# Patient Record
Sex: Female | Born: 1990 | Hispanic: Yes | Marital: Single | Smoking: Never smoker
Health system: Southern US, Community
[De-identification: ages and names within clinical notes are randomized; demographics above are authoritative.]

## PROBLEM LIST (undated history)

## (undated) DIAGNOSIS — K8021 Calculus of gallbladder without cholecystitis with obstruction: Secondary | ICD-10-CM

## (undated) DIAGNOSIS — N12 Tubulo-interstitial nephritis, not specified as acute or chronic: Secondary | ICD-10-CM

## (undated) DIAGNOSIS — O24419 Gestational diabetes mellitus in pregnancy, unspecified control: Secondary | ICD-10-CM

## (undated) HISTORY — DX: Calculus of gallbladder without cholecystitis with obstruction: K80.21

## (undated) HISTORY — PX: NO PAST SURGERIES: SHX2092

## (undated) HISTORY — DX: Gestational diabetes mellitus in pregnancy, unspecified control: O24.419

---
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
## 2014-03-05 ENCOUNTER — Encounter: Payer: Self-pay | Admitting: Obstetrics and Gynecology

## 2014-03-26 ENCOUNTER — Encounter: Payer: Self-pay | Admitting: Obstetrics and Gynecology

## 2014-04-23 ENCOUNTER — Encounter: Payer: Self-pay | Admitting: Obstetrics and Gynecology

## 2014-09-12 ENCOUNTER — Inpatient Hospital Stay: Payer: Self-pay | Admitting: Obstetrics and Gynecology

## 2014-09-12 LAB — CBC WITH DIFFERENTIAL/PLATELET
BASOS ABS: 0 10*3/uL (ref 0.0–0.1)
BASOS PCT: 0.3 %
EOS ABS: 0.2 10*3/uL (ref 0.0–0.7)
Eosinophil %: 1.5 %
HCT: 37.9 % (ref 35.0–47.0)
HGB: 12.5 g/dL (ref 12.0–16.0)
Lymphocyte #: 2.2 10*3/uL (ref 1.0–3.6)
Lymphocyte %: 19.3 %
MCH: 27.9 pg (ref 26.0–34.0)
MCHC: 32.9 g/dL (ref 32.0–36.0)
MCV: 85 fL (ref 80–100)
MONO ABS: 0.7 x10 3/mm (ref 0.2–0.9)
Monocyte %: 6.1 %
NEUTROS ABS: 8.5 10*3/uL — AB (ref 1.4–6.5)
Neutrophil %: 72.8 %
Platelet: 185 10*3/uL (ref 150–440)
RBC: 4.47 10*6/uL (ref 3.80–5.20)
RDW: 13.9 % (ref 11.5–14.5)
WBC: 11.6 10*3/uL — ABNORMAL HIGH (ref 3.6–11.0)

## 2014-09-13 LAB — GC/CHLAMYDIA PROBE AMP

## 2014-09-13 LAB — HEMOGLOBIN: HGB: 11.6 g/dL — ABNORMAL LOW (ref 12.0–16.0)

## 2014-12-26 NOTE — Consult Note (Signed)
Referral Information:  Reason for Referral h/o late second/ early third trimester loss   Referring Physician [facility]   Prenatal Hx 24 yo g4 [id number] married Latina female with L[facility] now [redacted] weeks gestation an interpreter was used for the interview. Her second pregnancy was abnormal - she developed LLQ pains at about 4 months - [place] . She had "too much  fluid " and "baby was stuck on one side" . She went into labor at 23-27 weeks and delivered a morphologically normal female who lived for a short time "skin was thin" (sounded like a second trimester fetus ) nothing abnormal noted otherwise- "normal size" . She declined autopsy due to reluctance to leave her daughter behind . Her mother discarded the pictures of the baby because she thought it would make her depression worse. Pt became tearful talking about the experience. The pt thinks she may be able to get a relative to retrieve records if available.   Past Obstetrical Hx 2007- female term 6+ lbs delivered in hospital (pt was 24yo) no complications 2009- female - 23-27 weeks neonatal demise abnormal fluid /stuck on one side on u/s per pt 2010- female term  38+ weeks 6+ lbs healthy  both of her children [place] with her mother [place]   Home Medications: Medication Instructions Status  Prenatal Multivitamins Prenatal Multivitamins oral tablet 1 tab(s) orally once a day Active   Allergies:   No Known Allergies:   Vital Signs/Notes:  Nursing Vital Signs: **Vital Signs.:   02-Jul-15 10:02  Vital Signs Type Routine  Temperature Temperature (F) 98.1  Celsius 36.7  Temperature Source oral  Pulse Pulse 76  Respirations Respirations 18  Systolic BP Systolic BP 129  Diastolic BP (mmHg) Diastolic BP (mmHg) 59  Mean BP 82  Pulse Ox % Pulse Ox % 100  Pulse Ox Activity Level  At rest  Oxygen Delivery Room Air/ 21 %   Perinatal Consult:  LMP 25-Dec-2013   PGyn Hx regular menses   PMed Hx  Rubella Immune, Hx of varicella   Past Medical History cont'd no HTn, no DM , no infectious dieases   PSurg Hx none   FHx paternal aunt had 4 pregnancy losses   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, agriculturalchemical exposures   Soc Hx married   Review Of Systems:  Subjective mild nausea    Additional Lab/Radiology Notes Aneg, antibody neg  RPR neg, hep B sag neg rub imm, varicella imm UC&S Klebsiella amp res sens to other antibiotics   Impression/Recommendations:  Impression IUP at 10 weeks by dates  1-h/o late second tri /early third trimester delivery and neonatal loss - pt has had other healthy outcomes, no underlying medical problems or exposures,Pt describes  "extra fluid " "stuck on one side" might be c/w arthrogryposis/ neuromuscular  type syndrome but without more documentation of the neonate's condition we will not be able to diagnose accurately, no gross anomaly noted by pt - aneuploidy is  another common cause of second tri loss,  pt declines Down syndrome screening. Given delivery was asociated with u/s diagnosis of "extra fluid, stuck fetus " I would not rx as if a spontaneous preterm birth so no 17 P . Also since not IUFD and since normally grown fetus lost will not check Antiphospholipid labs. 2 Rh neg no irregular antibodies 3 Klebsiella UTI   Recommendations 1. Pt will contact family and see if  any records are available to bring for u/s appt  2. She will return for a scan at the end of July she will be 13-14 weeks then ,  3. Rho Gam at 28 weeks and pp 4. antibiotics for UTI pt going to [facility] after our appt reassured her as to safety and necessity of Rx , repeat C&S at  [facility] routine or  q trimester   Plan:  Genetic Counseling yes, will offer at next visit   Prenatal Diagnosis Options u/s ordered   Delivery Mode Vaginal   Additional Testing Folate/prenatal vitamins   Delivery at what gestational age per routine     Total Time Spent with Patient 30 minutes   >50% of visit spent in couseling/coordination of care yes   Office Use Only [id number]  Level 2 ( ) NEW office consult exp prob focused   Coding Description: MATERNAL CONDITIONS/HISTORY INDICATION(S).   History of congenital anomaly.  Electronic Signatures: Rondall Allegra (MD)  (Signed 02-Jul-15 11:33)  Authored: Referral, Home Medications, Allergies, Vital Signs/Notes, Consult, Exam, Lab/Radiology Notes, Impression, Plan, Billing, Coding Description   Last Updated: 02-Jul-15 11:33 by Rondall Allegra (MD)

## 2017-03-16 ENCOUNTER — Emergency Department: Payer: Self-pay

## 2017-03-16 ENCOUNTER — Encounter: Payer: Self-pay | Admitting: *Deleted

## 2017-03-16 ENCOUNTER — Emergency Department
Admission: EM | Admit: 2017-03-16 | Discharge: 2017-03-16 | Disposition: A | Discharge status: Home / Self Care | Payer: Self-pay | Attending: Emergency Medicine | Admitting: Emergency Medicine

## 2017-03-16 DIAGNOSIS — K8020 Calculus of gallbladder without cholecystitis without obstruction: Secondary | ICD-10-CM | POA: Insufficient documentation

## 2017-03-16 DIAGNOSIS — K8021 Calculus of gallbladder without cholecystitis with obstruction: Secondary | ICD-10-CM

## 2017-03-16 LAB — COMPREHENSIVE METABOLIC PANEL
ALBUMIN: 3.8 g/dL (ref 3.5–5.0)
ALT: 24 U/L (ref 14–54)
ANION GAP: 6 (ref 5–15)
AST: 22 U/L (ref 15–41)
Alkaline Phosphatase: 85 U/L (ref 38–126)
BUN: 13 mg/dL (ref 6–20)
CALCIUM: 8.6 mg/dL — AB (ref 8.9–10.3)
CO2: 27 mmol/L (ref 22–32)
Chloride: 104 mmol/L (ref 101–111)
Creatinine, Ser: 0.76 mg/dL (ref 0.44–1.00)
GFR calc non Af Amer: >60 mL/min (ref >60)
GLUCOSE: 118 mg/dL — AB (ref 65–99)
Potassium: 3.9 mmol/L (ref 3.5–5.1)
SODIUM: 137 mmol/L (ref 135–145)
Total Bilirubin: 0.4 mg/dL (ref 0.3–1.2)
Total Protein: 7.1 g/dL (ref 6.5–8.1)

## 2017-03-16 LAB — CBC
HCT: 37.3 % (ref 35.0–47.0)
Hemoglobin: 12.6 g/dL (ref 12.0–16.0)
MCH: 28.2 pg (ref 26.0–34.0)
MCHC: 33.9 g/dL (ref 32.0–36.0)
MCV: 83.2 fL (ref 80.0–100.0)
Platelets: 241 10*3/uL (ref 150–440)
RBC: 4.48 MIL/uL (ref 3.80–5.20)
RDW: 14 % (ref 11.5–14.5)
WBC: 9.2 10*3/uL (ref 3.6–11.0)

## 2017-03-16 LAB — URINALYSIS, COMPLETE (UACMP) WITH MICROSCOPIC
Bilirubin Urine: NEGATIVE
GLUCOSE, UA: NEGATIVE mg/dL
Hgb urine dipstick: NEGATIVE
KETONES UR: NEGATIVE mg/dL
Nitrite: NEGATIVE
PROTEIN: NEGATIVE mg/dL
Specific Gravity, Urine: 1.024 (ref 1.005–1.030)
pH: 5 (ref 5.0–8.0)

## 2017-03-16 LAB — LIPASE, BLOOD: LIPASE: 24 U/L (ref 11–51)

## 2017-03-16 LAB — POCT PREGNANCY, URINE: PREG TEST UR: NEGATIVE

## 2017-03-16 MED ORDER — ONDANSETRON HCL 4 MG/2ML IJ SOLN
4.0000 mg | INTRAMUSCULAR | Status: AC
  Administered 2017-03-16: 4 mg via INTRAVENOUS
  Filled 2017-03-16: qty 2

## 2017-03-16 MED ORDER — MORPHINE SULFATE (PF) 4 MG/ML IV SOLN
4.0000 mg | Freq: Once | INTRAVENOUS | Status: AC
  Administered 2017-03-16: 4 mg via INTRAVENOUS
  Filled 2017-03-16: qty 1

## 2017-03-16 MED ORDER — KETOROLAC TROMETHAMINE 30 MG/ML IJ SOLN
15.0000 mg | Freq: Once | INTRAMUSCULAR | Status: AC
  Administered 2017-03-16: 15 mg via INTRAVENOUS
  Filled 2017-03-16: qty 1

## 2017-03-16 MED ORDER — ONDANSETRON HCL 4 MG/2ML IJ SOLN: 4.0000 mg | Freq: Once | INTRAMUSCULAR | Status: DC | PRN

## 2017-03-16 MED ORDER — TRAMADOL HCL 50 MG PO TABS: 50.0000 mg | ORAL_TABLET | Freq: Four times a day (QID) | ORAL | 0 refills | Status: AC | PRN

## 2017-03-16 NOTE — ED Notes (Signed)
Pt called out and interpreter request was made by Belenda CruiseKristin, RN

## 2017-03-16 NOTE — ED Notes (Signed)
Interpreter facilitated communication.  Pt wanted to know when the surgeon would be in to speak to her.

## 2017-03-16 NOTE — ED Triage Notes (Signed)
Pt c/o epigastric pain x 4 days, nausea and denies vomiting and diarrhea.

## 2017-03-16 NOTE — Consult Note (Signed)
SURGICAL CONSULTATION NOTE (initial) - cpt: 69629 (Outpatient/ED)  HISTORY OF PRESENT ILLNESS (HPI):  26 y.o. spanish-speaking female presented to [facility] ED with RUQ > epigastric abdominal pain x 6.5 days. She states the pain first awoke her from sleep ~2 am after eating pork and cheese empanadas. It then initially began to improve until she ate beef and cheese the following night, after which the pain returned, but again improved without intervention. After the pain recurred similarly once more (and more severely) last night after again eating dinner containing meat and cheese, patient presented to [facility] ED for further evaluation. Patient reports mild nausea sometimes follows the pain, but she otherwise denies any emesis, diarrhea, fever/chills, CP, or SOB. She also states the pain has near-completely resolved since resolving a dose of Toradol a few hours ago, and it has not since returned. Other than these episodes described above, patient reports she is not aware of any similar prior episodes.  The entirety of the above history was obtained with the assistance of translation services present bedside.  Surgery is consulted by ED physician Dr. Cyril Loosen in this context for evaluation and management of symptomatic cholelithiasis.  PAST MEDICAL HISTORY (PMH):  History reviewed. No pertinent past medical history.   PAST SURGICAL HISTORY (PSH):  History reviewed. No pertinent surgical history.   MEDICATIONS:  Prior to Admission medications   Not on File     ALLERGIES:  No Known Allergies   SOCIAL HISTORY:  Social History   Social History  . Marital status: Single    Spouse name: N/A  . Number of children: N/A  . Years of education: N/A   Occupational History  . Not on file.   Social History Main Topics  . Smoking status: Never Smoker  . Smokeless tobacco: Never Used  . Alcohol use No  . Drug use: No  . Sexual activity: Not on file   Other Topics Concern  . Not on file   Social  History Narrative  . No narrative on file    The patient currently resides (home / rehab facility / nursing home): Home  The patient normally is (ambulatory / bedbound): Ambulatory   FAMILY HISTORY:  History reviewed. No pertinent family history.   REVIEW OF SYSTEMS:  Constitutional: denies weight loss, fever, chills, or sweats  Eyes: denies any other vision changes, history of eye injury  ENT: denies sore throat, hearing problems  Respiratory: denies shortness of breath, wheezing  Cardiovascular: denies chest pain, palpitations  Gastrointestinal: abdominal pain, N/V, and bowel function as per HPI Genitourinary: denies burning with urination or urinary frequency Musculoskeletal: denies any other joint pains or cramps  Skin: denies any other rashes or skin discolorations  Neurological: denies any other headache, dizziness, weakness  Psychiatric: denies any other depression, anxiety   All other review of systems were negative   VITAL SIGNS:  Temp:  [98.7 F (37.1 C)] 98.7 F (37.1 C) (07/13 0537) Pulse Rate:  [59-75] 59 (07/13 0937) Resp:  [16-20] 16 (07/13 0937) BP: (111-128)/(62-88) 120/78 (07/13 0937) SpO2:  [100 %] 100 % (07/13 0937) Weight:  [205 lb (93 kg)] 205 lb (93 kg) (07/13 0538)     Height: 5\' 3"  (160 cm) Weight: 205 lb (93 kg) BMI (Calculated): 36.4   INTAKE/OUTPUT:  This shift: No intake/output data recorded.  Last 2 shifts: @IOLAST2SHIFTS @   PHYSICAL EXAM:  Constitutional:  -- Overweight body habitus  -- Awake, alert, and oriented x3  Eyes:  -- Pupils equally round and reactive  to light  -- No scleral icterus  Ear, nose, and throat:  -- No jugular venous distension  Pulmonary:  -- No crackles  -- Equal breath sounds bilaterally -- Breathing non-labored at rest Cardiovascular:  -- S1, S2 present  -- No pericardial rubs Gastrointestinal:  -- Abdomen soft and nondistended with mild RUQ tenderness to deep palpation with no guarding/rebound  -- No  abdominal masses appreciated, pulsatile or otherwise  Musculoskeletal and Integumentary:  -- Wounds or skin discoloration: None appreciated -- Extremities: B/L UE and LE FROM, hands and feet warm, no edema  Neurologic:  -- Motor function: intact and symmetric -- Sensation: intact and symmetric  Labs:  CBC Latest Ref Rng & Units 03/16/2017 09/13/2014 09/12/2014  WBC 3.6 - 11.0 K/uL 9.2 - 11.6(H)  Hemoglobin 12.0 - 16.0 g/dL 78.212.6 11.6(L) 12.5  Hematocrit 35.0 - 47.0 % 37.3 - 37.9  Platelets 150 - 440 K/uL 241 - 185   CMP Latest Ref Rng & Units 03/16/2017  Glucose 65 - 99 mg/dL 956(O118(H)  BUN 6 - 20 mg/dL 13  Creatinine 1.300.44 - 8.651.00 mg/dL 7.840.76  Sodium 696135 - 295145 mmol/L 137  Potassium 3.5 - 5.1 mmol/L 3.9  Chloride 101 - 111 mmol/L 104  CO2 22 - 32 mmol/L 27  Calcium 8.9 - 10.3 mg/dL 2.8(U8.6(L)  Total Protein 6.5 - 8.1 g/dL 7.1  Total Bilirubin 0.3 - 1.2 mg/dL 0.4  Alkaline Phos 38 - 126 U/L 85  AST 15 - 41 U/L 22  ALT 14 - 54 U/L 24    Imaging studies:  Limited RUQ Abdominal Ultrasound (03/16/2017) Echogenic stones present within the gallbladder lumen. One measuring 1.3 cm appears nonmobile and lodged at the gallbladder neck. Gallbladder wall measured at upper limits of normal 3 mm. No free pericholecystic fluid. Positive sonographic Murphy sign elicited.  Common bile duct diameter measures 3.3 mm  Assessment/Plan: (ICD-10's: 29K80.21) 26 y.o. female with nearly resolved abdominal pain attributable to symptomatic cholelithiasis without evidence of acute cholecystitis, complicated by pertinent comorbidities including obesity and lack of routine preventative health care.   - no urgent surgical intervention indicated at this time  - minimize / avoid fattier foods (meats, cheeses/dairy, and fried foods), preferring instead grains, vegetables, and fruits   - outpatient surgical follow-up to further discuss cholecystectomy and provide insurance/assistance application   - DVT prophylaxis  All  of the above findings and recommendations were discussed with the patient and her family, and all of patient's and her family's questions were answered to their expressed satisfaction.  Thank you for the opportunity to participate in this patient's care.   -- Scherrie GerlachJason E. Earlene Plateravis, MD, RPVI [place]: [facility] General [facility] - Partnering for exceptional care. Office: [phone]

## 2017-03-16 NOTE — ED Provider Notes (Signed)
[facility] Emergency Department Provider Note  ____________________________________________   First MD Initiated Contact with Patient 03/16/17 [id number]     (approximate)  I have reviewed the triage vital signs and the nursing notes.   HISTORY  Chief Complaint Abdominal Pain  The patient and/or family speak(s) Spanish.  They understand they have the right to the use of a hospital interpreter, however at this time they prefer to speak directly with me in Spanish.  They know that they can ask for an interpreter at any time.   HPI Cheyenne Gomez is a 26 y.o. female who presents for evaluation of persistent epigastric abdominal pain over the last 4 days.  It gets worse from time to time and she reports it is worse during the night and in the early morning as well as after she eats.  It feels like a dull but severe ache and pressure in the upper middle part of her abdomen.  Nothing makes it better.  It is accompanied with nausea but she has not had any vomiting.  She has had some decreased appetite.  She denies diarrhea and constipation.  She denies fever/chills, chest pain, shortness of breath.   History reviewed. No pertinent past medical history.  There are no active problems to display for this patient.   History reviewed. No pertinent surgical history.  Prior to Admission medications   Not on File    Allergies Patient has no known allergies.  History reviewed. No pertinent family history.  Social History Social History  Substance Use Topics  . Smoking status: Never Smoker  . Smokeless tobacco: Never Used  . Alcohol use No    Review of Systems Constitutional: No fever/chills Eyes: No visual changes. ENT: No sore throat. Cardiovascular: Denies chest pain. Respiratory: Denies shortness of breath. Gastrointestinal: Upper central abdominal pain.  Nausea, no vomiting.  No diarrhea.  No constipation. Genitourinary: Negative for  dysuria. Musculoskeletal: Negative for neck pain.  Negative for back pain. Integumentary: Negative for rash. Neurological: Negative for headaches, focal weakness or numbness.   ____________________________________________   PHYSICAL EXAM:  VITAL SIGNS: ED Triage Vitals  Enc Vitals Group     BP 03/16/17 0537 111/62     Pulse Rate 03/16/17 0537 75     Resp 03/16/17 0537 20     Temp 03/16/17 0537 98.7 F (37.1 C)     Temp Source 03/16/17 0537 Oral     SpO2 03/16/17 0537 100 %     Weight 03/16/17 0538 93 kg (205 lb)     Height 03/16/17 0538 1.6 m (5\' 3" )     Head Circumference --      Peak Flow --      Pain Score 03/16/17 0537 10     Pain Loc --      Pain Edu? --      Excl. in GC? --     Constitutional: Alert and oriented. Generally well appearing but does appear uncomfortable Eyes: Conjunctivae are normal.  Head: Atraumatic. Nose: No congestion/rhinnorhea. Mouth/Throat: Mucous membranes are moist. Neck: No stridor.  No meningeal signs.   Cardiovascular: Normal rate, regular rhythm. Good peripheral circulation. Grossly normal heart sounds. Respiratory: Normal respiratory effort.  No retractions. Lungs CTAB. Gastrointestinal: Soft with severe tenderness to palpation in the epigastrium and RUQ with +Murphy's sign.  No lower abd tenderness. Musculoskeletal: No lower extremity tenderness nor edema. No gross deformities of extremities. Neurologic:  Normal speech and language. No gross focal neurologic deficits are appreciated.  Skin:  Skin is warm, dry and intact. No rash noted. Psychiatric: Mood and affect are normal. Speech and behavior are normal.  ____________________________________________   LABS (all labs ordered are listed, but only abnormal results are displayed)  Labs Reviewed  COMPREHENSIVE METABOLIC PANEL - Abnormal; Notable for the following:       Result Value   Glucose, Bld 118 (*)    Calcium 8.6 (*)    All other components within normal limits   URINALYSIS, COMPLETE (UACMP) WITH MICROSCOPIC - Abnormal; Notable for the following:    Color, Urine YELLOW (*)    APPearance HAZY (*)    Leukocytes, UA SMALL (*)    Bacteria, UA RARE (*)    Squamous Epithelial / LPF 6-30 (*)    All other components within normal limits  LIPASE, BLOOD  CBC  POC URINE PREG, ED  POCT PREGNANCY, URINE   ____________________________________________  EKG  None - EKG not ordered by ED physician ____________________________________________  RADIOLOGY   US Abdomen Limited Ruq  Result Date: 03/16/2017 CLINICAL DATA:  Initial evaluation for acute severe persistent right upper quadrant/epigastric pain. EXAM: ULTRASOUND ABDOMEN LIMITED RIGHT UPPER QUADRANT COMPARISON:  None. FINDINGS: Gallbladder: Echogenic stones present within the gallbladder lumen. One measuring 1.3 cm appears nonmobile and lodged at the gallbladder neck. Gallbladder wall measured at upper limits of normal 3 mm. No free pericholecystic fluid. Positive sonographic Murphy sign elicited on exam. Common bile duct: Diameter: 3.3 mm Liver: No focal lesion identified. Within normal limits in parenchymal echogenicity. IMPRESSION: 1. 1.3 cm stone lodged at the gallbladder neck with positive sonographic Murphy sign. No other sonographic features for acute cholecystitis. 2. No biliary dilatation. Electronically Signed   By: Rise Mu M.D.   On: 03/16/2017 07:00    ____________________________________________   PROCEDURES  Critical Care performed: No   Procedure(s) performed:   Procedures   ____________________________________________   INITIAL IMPRESSION / ASSESSMENT AND PLAN / ED COURSE  Pertinent labs & imaging results that were available during my care of the patient were reviewed by me and considered in my medical decision making (see chart for details).  The fact that the patient's symptoms are always worse at night and in the early morning suggests possibly that she is  suffering from GERD, but she is very tender to palpation epigastrium and right upper quadrant with positive Murphy sign.  I feel that she would benefit from evaluation for gallstones.  Her vital signs are stable.  We will check standard lab work and obtain an ultrasound.  Morphine and Zofran for symptom control at this time.   Clinical Course as of Mar 17 755  Fri Mar 16, 2017  1914 No evidence of cholecystitis but the patient does have a stone stuck in the neck of her gallbladder.  I am giving a dose of Toradol as well although upon reassessment the patient states she does feel better although she does have some residual pain.  I called and spoke with him with Dr. Earlene Plater the general surgeon on call.  He said he would come to the emergency department to evaluate her although it is very unlikely that he will perform surgery on her due to a full schedule and the fact that she does not need emergent or urgent surgery.  I updated the patient about this and she states that she was still rather wait for his evaluation.  I am transferring ED care to Dr. Cyril Loosen with the plan to touch base with Dr. Earlene Plater after  he evaluates the patient. US Abdomen Limited RUQ [CF]    Clinical Course User Index [CF] Loleta Rose, MD    ____________________________________________  FINAL CLINICAL IMPRESSION(S) / ED DIAGNOSES  Final diagnoses:  Calculus of gallbladder without cholecystitis without obstruction     MEDICATIONS GIVEN DURING THIS VISIT:  Medications  ondansetron (ZOFRAN) injection 4 mg (not administered)  ketorolac (TORADOL) 30 MG/ML injection 15 mg (not administered)  morphine 4 MG/ML injection 4 mg (4 mg Intravenous Given 03/16/17 0612)  ondansetron (ZOFRAN) injection 4 mg (4 mg Intravenous Given 03/16/17 0612)     NEW OUTPATIENT MEDICATIONS STARTED DURING THIS VISIT:  New Prescriptions   No medications on file    Modified Medications   No medications on file    Discontinued Medications   No  medications on file     Note:  This document was prepared using Dragon voice recognition software and may include unintentional dictation errors.    Loleta Rose, MD 03/16/17 [phone]

## 2017-03-16 NOTE — ED Provider Notes (Addendum)
Dr. Earlene Plateravis in with patient    Recommends d/c. Has set up outpatient appointment for patient with him on 7/19 at 11:15 am. Patient comfortable with this plan     Jene EveryKinner, Coryn Mosso, MD 03/16/17 1218    Jene EveryKinner, Diana Davenport, MD 03/16/17 1226

## 2017-03-16 NOTE — ED Notes (Signed)
Patient transported to Ultrasound 

## 2017-03-16 NOTE — Discharge Instructions (Signed)
No meats, cheeses or fried food!

## 2017-03-16 NOTE — ED Provider Notes (Signed)
Pending surgery consultation   Cheyenne Gomez, Cheyenne Soward, MD 03/16/17 1022

## 2017-03-22 ENCOUNTER — Inpatient Hospital Stay: Payer: Self-pay | Admitting: Surgery

## 2017-03-27 DIAGNOSIS — K8021 Calculus of gallbladder without cholecystitis with obstruction: Secondary | ICD-10-CM | POA: Insufficient documentation

## 2017-06-07 ENCOUNTER — Emergency Department: Payer: Self-pay

## 2017-06-07 ENCOUNTER — Encounter: Payer: Self-pay | Admitting: Emergency Medicine

## 2017-06-07 ENCOUNTER — Emergency Department
Admission: EM | Admit: 2017-06-07 | Discharge: 2017-06-07 | Disposition: A | Discharge status: Home / Self Care | Payer: Self-pay | Attending: Emergency Medicine | Admitting: Emergency Medicine

## 2017-06-07 DIAGNOSIS — Z79899 Other long term (current) drug therapy: Secondary | ICD-10-CM | POA: Insufficient documentation

## 2017-06-07 DIAGNOSIS — R1010 Upper abdominal pain, unspecified: Secondary | ICD-10-CM | POA: Insufficient documentation

## 2017-06-07 DIAGNOSIS — R1110 Vomiting, unspecified: Secondary | ICD-10-CM | POA: Insufficient documentation

## 2017-06-07 DIAGNOSIS — K8050 Calculus of bile duct without cholangitis or cholecystitis without obstruction: Secondary | ICD-10-CM

## 2017-06-07 DIAGNOSIS — R112 Nausea with vomiting, unspecified: Secondary | ICD-10-CM | POA: Insufficient documentation

## 2017-06-07 LAB — COMPREHENSIVE METABOLIC PANEL
ALBUMIN: 4 g/dL (ref 3.5–5.0)
ALT: 23 U/L (ref 14–54)
ANION GAP: 7 (ref 5–15)
AST: 23 U/L (ref 15–41)
Alkaline Phosphatase: 88 U/L (ref 38–126)
BUN: 13 mg/dL (ref 6–20)
CO2: 28 mmol/L (ref 22–32)
Calcium: 8.9 mg/dL (ref 8.9–10.3)
Chloride: 107 mmol/L (ref 101–111)
Creatinine, Ser: 0.69 mg/dL (ref 0.44–1.00)
GFR calc Af Amer: >60 mL/min (ref >60)
GFR calc non Af Amer: >60 mL/min (ref >60)
GLUCOSE: 123 mg/dL — AB (ref 65–99)
POTASSIUM: 3.7 mmol/L (ref 3.5–5.1)
SODIUM: 142 mmol/L (ref 135–145)
Total Bilirubin: 0.3 mg/dL (ref 0.3–1.2)
Total Protein: 7.2 g/dL (ref 6.5–8.1)

## 2017-06-07 LAB — LIPASE, BLOOD: LIPASE: 23 U/L (ref 11–51)

## 2017-06-07 LAB — CBC
HEMATOCRIT: 37.9 % (ref 35.0–47.0)
HEMOGLOBIN: 12.7 g/dL (ref 12.0–16.0)
MCH: 27.4 pg (ref 26.0–34.0)
MCHC: 33.6 g/dL (ref 32.0–36.0)
MCV: 81.5 fL (ref 80.0–100.0)
Platelets: 273 10*3/uL (ref 150–440)
RBC: 4.65 MIL/uL (ref 3.80–5.20)
RDW: 13.6 % (ref 11.5–14.5)
WBC: 10.3 10*3/uL (ref 3.6–11.0)

## 2017-06-07 LAB — POCT PREGNANCY, URINE: Preg Test, Ur: NEGATIVE

## 2017-06-07 MED ORDER — ONDANSETRON 4 MG PO TBDP: 4.0000 mg | ORAL_TABLET | Freq: Three times a day (TID) | ORAL | 0 refills | Status: DC | PRN

## 2017-06-07 MED ORDER — DICYCLOMINE HCL 20 MG PO TABS: 20.0000 mg | ORAL_TABLET | Freq: Three times a day (TID) | ORAL | 0 refills | Status: DC | PRN

## 2017-06-07 MED ORDER — IBUPROFEN 600 MG PO TABS: 600.0000 mg | ORAL_TABLET | Freq: Three times a day (TID) | ORAL | 0 refills | Status: DC | PRN

## 2017-06-07 MED ORDER — FENTANYL CITRATE (PF) 100 MCG/2ML IJ SOLN
50.0000 ug | Freq: Once | INTRAMUSCULAR | Status: AC
  Administered 2017-06-07: 50 ug via INTRAVENOUS
  Filled 2017-06-07: qty 2

## 2017-06-07 MED ORDER — ONDANSETRON HCL 4 MG/2ML IJ SOLN
4.0000 mg | Freq: Once | INTRAMUSCULAR | Status: AC
  Administered 2017-06-07: 4 mg via INTRAVENOUS
  Filled 2017-06-07: qty 2

## 2017-06-07 MED ORDER — SODIUM CHLORIDE 0.9 % IV BOLUS (SEPSIS)
1000.0000 mL | Freq: Once | INTRAVENOUS | Status: AC
  Administered 2017-06-07: 1000 mL via INTRAVENOUS

## 2017-06-07 NOTE — ED Provider Notes (Signed)
[facility] Emergency Department Provider Note   ____________________________________________   First MD Initiated Contact with Patient 06/07/17 [phone]     (approximate)  I have reviewed the triage vital signs and the nursing notes.   HISTORY  Chief Complaint Abdominal Pain and Vomiting  History obtained via Spanish interpreter  HPI Cheyenne Gomez is a 26 y.o. female who presents to the ED from home with a chief complaint of abdominal pain and vomiting. Patient reports a prior history of gallstones who developed epigastric pain and vomiting 5 since last evening.Denies associated fever, chills, chest pain, shortness of breath, dysuria, diarrhea. Denies recent travel or trauma. Nothing makes her symptoms better or worse.   Past medical history None  Patient Active Problem List   Diagnosis Date Noted  . Calculus of gallbladder with biliary obstruction but without cholecystitis     History reviewed. No pertinent surgical history.  Prior to Admission medications   Medication Sig Start Date End Date Taking? Authorizing Provider  traMADol (ULTRAM) 50 MG tablet Take 1 tablet (50 mg total) by mouth every 6 (six) hours as needed. 03/16/17 03/16/18  Jene Every, MD    Allergies Patient has no known allergies.  History reviewed. No pertinent family history.  Social History Social History  Substance Use Topics  . Smoking status: Never Smoker  . Smokeless tobacco: Never Used  . Alcohol use No    Review of Systems  Constitutional: No fever/chills. Eyes: No visual changes. ENT: No sore throat. Cardiovascular: Denies chest pain. Respiratory: Denies shortness of breath. Gastrointestinal: Positive for abdominal pain, nausea and vomiting.  No diarrhea.  No constipation. Genitourinary: Negative for dysuria. Musculoskeletal: Negative for back pain. Skin: Negative for rash. Neurological: Negative for headaches, focal weakness or  numbness.   ____________________________________________   PHYSICAL EXAM:  VITAL SIGNS: ED Triage Vitals  Enc Vitals Group     BP 06/07/17 0545 114/74     Pulse Rate 06/07/17 0545 64     Resp 06/07/17 0545 18     Temp 06/07/17 0545 97.8 F (36.6 C)     Temp Source 06/07/17 0545 Oral     SpO2 06/07/17 0545 99 %     Weight 06/07/17 0545 200 lb (90.7 kg)     Height 06/07/17 0545  (1.575 m)     Head Circumference --      Peak Flow --      Pain Score 06/07/17 0544 10     Pain Loc --      Pain Edu? --      Excl. in GC? --     Constitutional: Alert and oriented. Well appearing and in mild acute distress. Eyes: Conjunctivae are normal. PERRL. EOMI. Head: Atraumatic. Nose: No congestion/rhinnorhea. Mouth/Throat: Mucous membranes are moist.  Oropharynx non-erythematous. Neck: No stridor.   Cardiovascular: Normal rate, regular rhythm. Grossly normal heart sounds.  Good peripheral circulation. Respiratory: Normal respiratory effort.  No retractions. Lungs CTAB. Gastrointestinal: Soft and moderately tender to palpation epigastrium and right upper quadrant without rebound or guarding. No distention. No abdominal bruits. No CVA tenderness. Musculoskeletal: No lower extremity tenderness nor edema.  No joint effusions. Neurologic:  Normal speech and language. No gross focal neurologic deficits are appreciated. No gait instability. Skin:  Skin is warm, dry and intact. No rash noted. Psychiatric: Mood and affect are normal. Speech and behavior are normal.  ____________________________________________   LABS (all labs ordered are listed, but only abnormal results are displayed)  Labs Reviewed  COMPREHENSIVE METABOLIC PANEL - Abnormal; Notable for the following:       Result Value   Glucose, Bld 123 (*)    All other components within normal limits  LIPASE, BLOOD  CBC  URINALYSIS, COMPLETE (UACMP) WITH MICROSCOPIC  POC URINE PREG, ED    ____________________________________________  EKG  ED ECG REPORT I, Shanora Christensen J, the attending physician, personally viewed and interpreted this ECG.   Date: 06/07/2017  EKG Time: 0601  Rate: 61  Rhythm: normal EKG, normal sinus rhythm  Axis: Normal  Intervals:none  ST&T Change: Nonspecific  ____________________________________________  RADIOLOGY  No results found.  ____________________________________________   PROCEDURES  Procedure(s) performed: None  Procedures  Critical Care performed: No  ____________________________________________   INITIAL IMPRESSION / ASSESSMENT AND PLAN / ED COURSE   26 year old female who presents with upper abdominal pain and vomiting. Differential diagnosis includes, but is not limited to, biliary disease (biliary colic, acute cholecystitis, cholangitis, choledocholithiasis, etc), intrathoracic causes for epigastric abdominal pain including ACS, gastritis, duodenitis, pancreatitis, small bowel or large bowel obstruction, abdominal aortic aneurysm, hernia, and gastritis.  Review of chart shows that she was seen on 03/16/2017 for similar presentation. Ultrasound at that time demonstrated lodged stone at the gallbladder neck without other signs of cholecystitis. Patient was seen in the ED by Dr. Earlene Plater from general surgery and she was supposed to follow-up in the clinic but did not do so. Will obtain screening lab work including LFTs and lipase, repeat upper abdominal ultrasound, initiate IV fluids, IV analgesia/antiemetic.  Clinical Course as of Jun 07 657  Thu Jun 07, 2017  1610 Patient back from ultrasound. Care transferred to Dr. Leanne Chang pending ultrasound results and disposition.  [JS]    Clinical Course User Index [JS] Irean Hong, MD     ____________________________________________   FINAL CLINICAL IMPRESSION(S) / ED DIAGNOSES  Final diagnoses:  Pain of upper abdomen  Nausea and vomiting, intractability of vomiting not  specified, unspecified vomiting type      NEW MEDICATIONS STARTED DURING THIS VISIT:  New Prescriptions   No medications on file     Note:  This document was prepared using Dragon voice recognition software and may include unintentional dictation errors.    Irean Hong, MD 06/07/17 [phone]

## 2017-06-07 NOTE — ED Triage Notes (Signed)
Pt presents to ED with c/o epigastric pain and vomiting X5 since last night.

## 2017-06-07 NOTE — Discharge Instructions (Signed)
Please make an appointment to follow up with the General Surgeon this coming week for a recheck.  YOU NEED TO HAVE YOUR GALLBLADDER REMOVED IF YOU WANT TO STOP HAVING THESE ATTACKS.  Return to the emergency department sooner for any new or worsening symptoms such as worsening pain, if you cannot eat or drink, or for any other concerns whatsoever.  It was a pleasure to take care of you today, and thank you for coming to our emergency department.  If you have any questions or concerns before leaving please ask the nurse to grab me and I'm more than happy to go through your aftercare instructions again.  If you were prescribed any opioid pain medication today such as Norco, Vicodin, Percocet, morphine, hydrocodone, or oxycodone please make sure you do not drive when you are taking this medication as it can alter your ability to drive safely.  If you have any concerns once you are home that you are not improving or are in fact getting worse before you can make it to your follow-up appointment, please do not hesitate to call [phone] and come back for further evaluation.  Merrily Brittle, MD  Results for orders placed or performed during the hospital encounter of 06/07/17  Lipase, blood  Result Value Ref Range   Lipase 23 11 - 51 U/L  Comprehensive metabolic panel  Result Value Ref Range   Sodium 142 135 - 145 mmol/L   Potassium 3.7 3.5 - 5.1 mmol/L   Chloride 107 101 - 111 mmol/L   CO2 28 22 - 32 mmol/L   Glucose, Bld 123 (H) 65 - 99 mg/dL   BUN 13 6 - 20 mg/dL   Creatinine, Ser 0.98 0.44 - 1.00 mg/dL   Calcium 8.9 8.9 - 11.9 mg/dL   Total Protein 7.2 6.5 - 8.1 g/dL   Albumin 4.0 3.5 - 5.0 g/dL   AST 23 15 - 41 U/L   ALT 23 14 - 54 U/L   Alkaline Phosphatase 88 38 - 126 U/L   Total Bilirubin 0.3 0.3 - 1.2 mg/dL   GFR calc non Af Amer >60 >60 mL/min   GFR calc Af Amer >60 >60 mL/min   Anion gap 7 5 - 15  CBC  Result Value Ref Range   WBC 10.3 3.6 - 11.0 K/uL   RBC 4.65 3.80 - 5.20 MIL/uL     Hemoglobin 12.7 12.0 - 16.0 g/dL   HCT 14.7 82.9 - 56.2 %   MCV 81.5 80.0 - 100.0 fL   MCH 27.4 26.0 - 34.0 pg   MCHC 33.6 32.0 - 36.0 g/dL   RDW 13.0 86.5 - 78.4 %   Platelets 273 150 - 440 K/uL   US Abdomen Limited Ruq  Result Date: 06/07/2017 CLINICAL DATA:  Epigastric pain and vomiting beginning last night. EXAM: ULTRASOUND ABDOMEN LIMITED RIGHT UPPER QUADRANT COMPARISON:  03/16/2017 FINDINGS: Gallbladder: 1.7 cm stone in the gallbladder neck which does not change with change in position. Wall thickness 2.6 mm. Negative Murphy sign. Common bile duct: Diameter: 3.1 mm common normal Liver: No focal lesion identified. Within normal limits in parenchymal echogenicity. Portal vein is patent on color Doppler imaging with normal direction of blood flow towards the liver. IMPRESSION: 1.7 cm gallstone in the gallbladder neck which does not move with change in position. Negative Murphy sign, though the patient is medicated. Electronically Signed   By: Paulina Fusi M.D.   On: 06/07/2017 07:02

## 2017-06-07 NOTE — ED Notes (Signed)
Pt given ginger ale to drink. 

## 2017-06-07 NOTE — ED Provider Notes (Signed)
The patient is afebrile, her pain is improved, her blood work is unremarkable, and her ultrasound shows no signs of cholecystitis. We will give her a by mouth challenge now and if she passes she'll be stable for follow-up.   Merrily Brittle, MD 06/07/17 [phone]

## 2017-06-14 ENCOUNTER — Inpatient Hospital Stay: Payer: Self-pay | Admitting: Surgery

## 2017-06-19 ENCOUNTER — Encounter: Payer: Self-pay | Admitting: General Practice

## 2017-06-19 ENCOUNTER — Telehealth: Payer: Self-pay | Admitting: General Practice

## 2017-06-19 NOTE — Telephone Encounter (Signed)
Unable to leave messages for patient to reschedule her missed appointment on 06/14/17, I have mailed a letter to patient to call the office to reschedule this appointment. Please schedule if patient calls back.

## 2017-06-28 ENCOUNTER — Inpatient Hospital Stay: Payer: Self-pay | Admitting: Surgery

## 2017-07-04 ENCOUNTER — Ambulatory Visit (INDEPENDENT_AMBULATORY_CARE_PROVIDER_SITE_OTHER): Payer: Self-pay | Admitting: Surgery

## 2017-07-04 VITALS — BP 119/76 | HR 79 | Temp 98.0°F | Wt 205.0 lb

## 2017-07-04 DIAGNOSIS — R1013 Epigastric pain: Secondary | ICD-10-CM

## 2017-07-04 MED ORDER — OMEPRAZOLE 40 MG PO CPDR: 40.0000 mg | DELAYED_RELEASE_CAPSULE | Freq: Every day | ORAL | 1 refills | Status: DC

## 2017-07-04 NOTE — Progress Notes (Signed)
07/04/2017  History of Present Illness: Cheyenne Gomez is a 26 y.o. female who presents as a follow up from the ED due to abdominal pain.  She had been to the ED now twice on 03/2017 and 06/2017 with the same abdominal pain.  She was seen by Dr. Earlene Plater but lost to follow up in 03/2017.  Patient reports that she has been having epigastric abdominal pain, at times associated with nausea, that happens while she's sleeping.  She works the night shift, so the pain would happen when she sleeps during the daytime.  This pain is not associated with food intake however.  For instance, she mentions that she eats at 1 am and then her pain wakes her up at 2 pm.  Sometimes the pain is associated with nausea and on her visit to the ED on 10/4, she had had some episodes of bilious emesis.  Otherwise the pain is strictly located in the center epigastric area and does not radiate anywhere.  She reports some intermittent acid reflux but does not take anything for it.  She was given prescription for ibuprofen, bentyl, and zofran, and she thinks these are helping some.  Past Medical History: Past Medical History:  Diagnosis Date  . Calculus of gallbladder with biliary obstruction but without cholecystitis      Past Surgical History: --None  Home Medications: Prior to Admission medications   Medication Sig Start Date End Date Taking? Authorizing Provider  dicyclomine (BENTYL) 20 MG tablet Take 1 tablet (20 mg total) by mouth 3 (three) times daily as needed for spasms. 06/07/17 06/07/18  Merrily Brittle, MD  ibuprofen (ADVIL,MOTRIN) 600 MG tablet Take 1 tablet (600 mg total) by mouth every 8 (eight) hours as needed. 06/07/17   Merrily Brittle, MD  ondansetron (ZOFRAN ODT) 4 MG disintegrating tablet Take 1 tablet (4 mg total) by mouth every 8 (eight) hours as needed for nausea or vomiting. 06/07/17   Merrily Brittle, MD  traMADol (ULTRAM) 50 MG tablet Take 1 tablet (50 mg total) by mouth every 6 (six) hours as  needed. 03/16/17 03/16/18  Jene Every, MD    Allergies: No Known Allergies  Social History:  reports that she has never smoked. She has never used smokeless tobacco. She reports that she does not drink alcohol or use drugs.   Family History: No family members with gallbladder disease  Review of Systems: Review of Systems  Constitutional: Negative for chills and fever.  HENT: Negative for hearing loss.   Respiratory: Negative for shortness of breath.   Cardiovascular: Negative for chest pain.  Gastrointestinal: Positive for abdominal pain, nausea and vomiting (only on her last episode). Negative for constipation and diarrhea.  Genitourinary: Negative for dysuria.  Musculoskeletal: Negative for myalgias.  Skin: Negative for rash.  Neurological: Negative for dizziness.  Psychiatric/Behavioral: Negative for depression.  All other systems reviewed and are negative.   Physical Exam BP 119/76   Pulse 79   Temp 98 F (36.7 C) (Oral)   Wt 93 kg (205 lb)   BMI 37.49 kg/m  CONSTITUTIONAL: No acute distress, well nourished. RESPIRATORY:  Lungs are clear, and breath sounds are equal bilaterally. Normal respiratory effort without pathologic use of accessory muscles. CARDIOVASCULAR: Heart is regular without murmurs, gallops, or rubs. GI: The abdomen is soft, obese, nondistended, currently non-tender to palpation. There were no palpable masses.  MUSCULOSKELETAL:  Normal muscle strength and tone in all four extremities.  No peripheral edema or cyanosis. SKIN: Skin turgor is normal. There are  no pathologic skin lesions.  NEUROLOGIC:  Motor and sensation is grossly normal.  Cranial nerves are grossly intact. PSYCH:  Alert and oriented to person, place and time. Affect is normal.  Labs/Imaging: Labs on 10/4 show normal LFTs, normal WBC of 10.3.  U/S on 10/4 shows a 1.7 cm stone in the gallbladder neck that does not appear to be mobile.  Normal gallbladder wall thickness and no  pericholecystic fluid.  Assessment and Plan: This is a 26 y.o. female who presents with epigastric abdominal pain.  Discussed with the patient that her symptom of pain and intermittent nausea are not as typical for biliary etiology.  Particularly the lack of relation to food intake and the fact that this happens only when she's sleeping.  As part of her differential, discussed that this could be related to her acid reflux and that we can try first a trial of Prilosec 40 mg daily to see how her symptoms do.  If there is no improvement, we can explore biliary etiology further.  Have also educated the patient on a low fat diet as a precaution.  She will follow up next month and discuss further her symptoms.  Patient understands this plan and all of her questions have been answered.  Face-to-face time spent with the patient and care providers was 25 minutes, with more than 50% of the time spent counseling, educating, and coordinating care of the patient.     Howie IllJose Luis Valery Chance, MD [facility]

## 2017-07-04 NOTE — Patient Instructions (Signed)
Dieta restringida en grasas y colesterol (Fat and Cholesterol Restricted Diet) El exceso de grasas y colesterol en la dieta puede causar problemas de salud. Esta dieta lo ayudar a Theatre manager las grasas y Freight forwarder en los niveles normales para evitar enfermarse. QU TIPOS DE GRASAS DEBO ELEGIR?  Elija grasas monosaturadas y polinsaturadas. Estas se encuentran en alimentos como el aceite de oliva, aceite de canola, semillas de lino, nueces, almendras y semillas.  Consuma ms grasas omega-3. Las mejores opciones incluyen salmn, caballa, sardinas, atn, aceite de lino y semillas de lino molidas.  Limite el consumo de grasas saturadas, que se encuentran en productos de origen animal, como carnes, mantequilla y crema. Tambin pueden estar en productos vegetales, como aceite de palma, de palmiste y de coco.  Evite los alimentos con aceites parcialmente hidrogenados. Estos contienen grasas trans. Entre los ejemplos de alimentos con grasas trans se incluyen margarinas en barra, algunas margarinas untables, galletas dulces o saladas y otros productos horneados. [place]?  Lea las etiquetas de los alimentos. Busque las palabras "grasas trans" y "grasas saturadas".  Al preparar una comida: ? Llene la mitad del plato con verduras y ensaladas de hojas verdes. ? Llene un cuarto del plato con cereales integrales. Busque la palabra "integral" en Equities trader de la lista de ingredientes. ? Llene un cuarto del plato con alimentos con protenas magras.  Ingiera alimentos con ms fibra, como [place], [place], frijoles, guisantes y [place].  Coma ms comidas caseras. Coma menos en los restaurantes y los bares.  Limite o evite el alcohol.  Limite los alimentos con alto contenido de almidn y Location manager.  Limite el consumo de alimentos fritos.  Cocine los alimentos sin frerlos. Las opciones de coccin ms [place] son Teacher, music, Adult nurse, Interior and spatial designer y asar a Administrator, arts.  Baje de  peso si es necesario. Aunque pierda Automatic Data, esto puede ser importante para la salud general. Tambin puede ayudar a prevenir enfermedades como diabetes y enfermedad cardaca. QU ALIMENTOS PUEDO COMER? Cereales Cereales integrales, como los panes de salvado o [place], las [place], los cereales y las pastas. Avena sin endulzar, trigo, [place], quinua o arroz integral. Tortillas de harina de maz o de salvado. Verduras Verduras frescas o congeladas (crudas, al vapor, asadas o grilladas). Ensaladas de hojas verdes. Lambert Mody Lambert Mody frescas, en conserva (en su jugo natural) o frutas congeladas. Carnes y otros productos con protenas Carne de res molida (al 85% o ms [place]), carne de res de animales alimentados con pastos o carne de res sin la grasa. Pollo o pavo sin piel. Carne de pollo o de [place]. Cerdo sin la grasa. Todos los pescados y frutos de mar. Huevos. Porotos, guisantes o lentejas secos. Frutos secos o semillas sin sal. Frijoles secos o en lata sin sal. Lcteos Productos lcteos con bajo contenido de grasas, como [place] o al 1%, quesos reducidos en grasas o al 2%, ricota con bajo contenido de grasas o [employer], o yogur natural con bajo contenido de [place]. Grasas y [employer] Express untables que no contengan grasas trans. [place] y condimentos para ensaladas livianos o reducidos en grasas. [place]. Aceites de oliva, canola, ssamo o crtamo. Mantequilla natural de cacahuate o almendra (elija la que no tenga agregado de aceite o azcar). Los artculos mencionados arriba pueden no ser [employer] de las bebidas o los alimentos recomendados. Comunquese con el nutricionista para conocer ms opciones. QU ALIMENTOS NO SE RECOMIENDAN? Cereales Pan blanco. Pastas blancas. Arroz blanco. Pan de maz. Bagels,  pasteles y croissants. Galletas saladas que contengan grasas trans. Verduras Papas blancas. MazHoover Brunette. Verduras con crema o fritas. Verduras en salsa de  [place] Springsqueso. Nils PyleFrutas Frutas secas. Fruta enlatada en almbar liviano o espeso. Jugo de frutas. Carnes y otros productos con protenas [place] de carne con Holiday representativegrasa. Costillas, alas de pollo, tocineta, salchicha, mortadela, salame, chinchulines, tocino, perros calientes, salchichas alemanas y embutidos envasados. Hgado y otros rganos. Lcteos Leche entera o al 2%, crema, mezcla de [place] y crema, y queso crema. Quesos enteros. Yogur entero o endulzado. Quesos con toda su grasa. Cremas no lcteas y coberturas batidas. Quesos procesados, quesos para untar o cuajadas. Dulces y postres Jarabe de maz, azcares, miel y Radio broadcast assistantmelazas. Caramelos. Mermelada y Kazakhstanjalea. Doreen BeamJarabe. Cereales endulzados. Galletas, pasteles, bizcochuelos, donas, muffins y helado. Grasas y [address]ace[address] [place], Indiamargarina en barra, G[place] Po[place] Parkmanteca de Fri[place] Pointcerdo, Valliantgrasa, Singaporemantequilla clarificada o grasa de tocino. Aceites de coco, de palmiste o de palma. Bebidas Alcohol. Bebidas endulzadas (como refrescos, limonadas y bebidas frutales o ponches). Los artculos mencionados arriba pueden no ser Raytheonuna lista completa de las bebidas y los alimentos que se Theatre stage managerdeben evitar. Comunquese con el nutricionista para obtener ms informacin. Esta informacin no tiene Theme park managercomo fin reemplazar el consejo del mdico. Asegrese de hacerle al mdico cualquier pregunta que tenga. Document Released: 02/20/2012 Document Revised: 09/11/2014 Document Reviewed: 11/20/2013 Elsevier Interactive Patient Education  2018 Elsevier Inc.   Enfermedad por reflujo gastroesofgico en los adultos (Gastroesophageal Reflux Disease, Adult) Normalmente, los alimentos descienden por el esfago y se depositan en el estmago para su digestin. Si una persona tiene enfermedad por reflujo gastroesofgico (ERGE), los alimentos y el cido estomacal regresan al esfago. Cuando esto ocurre, el esfago se irrita y se hincha (inflama). Con el tiempo, la ERGE puede provocar la formacin de pequeas perforaciones (lceras) en  la mucosa del esfago. CUIDADOS EN EL HOGAR Dieta  Siga la dieta como se lo haya indicado el mdico. Tal vez deba evitar los siguientes alimentos y bebidas: ? Caf y t (con o sin cafena). ? Bebidas que contengan alcohol. ? Bebidas energizantes y deportivas. ? Gaseosas o refrescos. ? Chocolate y cacao. ? Menta y esencias de [address] Drmenta. ? Ajo y cebollas. ? Rbano picante. ? Alimentos muy condimentados y cidos, como pimientos, [place] en polvo, curry en polvo, vinagre, salsas picantes y Engineer, watersalsa barbacoa. ? Frutas ctricas y sus jugos, como naranjas, limones y limas. ? Alimentos a base de tomates, como salsa roja, [place], salsa y pizza con salsa roja. ? Alimentos fritos y Lexicographergrasos, como rosquillas, papas fritas y aderezos con alto contenido de Holiday representativegrasa. ? Carnes con alto contenido de Dos Palosgrasa, como hot dogs, filetes de entrecot, salchicha, jamn y tocino. ? Productos lcteos con alto contenido de grasa, como [place] Hollowleche entera, Gilsonmantequilla y Lake Brownwoodqueso crema.  Consuma pequeas porciones de comida con ms frecuencia. Evite consumir porciones abundantes.  Evite beber mucho lquido con las comidas.  No coma durante las 2 o 3horas previas a la hora de Marengoacostarse.  No se acueste inmediatamente despus de comer.  No haga actividad fsica enseguida despus de comer. Instrucciones generales  Est atento a cualquier cambio en los sntomas.  Tome los medicamentos de venta libre y los recetados solamente como se lo haya indicado el mdico. No tome aspirina, ibuprofeno ni otros antiinflamatorios no esteroides (AINE), a menos que el mdico lo autorice.  No consuma ningn producto que contenga tabaco, lo que incluye cigarrillos, tabaco de Theatre managermascar y Administrator, Civil Servicecigarrillos electrnicos. Si necesita ayuda para dejar de fumar, consulte al mdico.  Use ropa suelta.  No use nada ajustado alrededor Reynolds American.  Levante (eleve) unas 6pulgadas (15centmetros) la cabecera de la cama.  Intente bajar el nivel de estrs. Si necesita ayuda  para hacerlo, consulte al American Express.  Si tiene sobrepeso, Media planner un peso saludable. Pregntele a su mdico cmo puede perder peso de manera segura.  Concurra a todas las visitas de control como se lo haya indicado el mdico. Esto es importante. SOLICITE AYUDA SI:  Aparecen nuevos sntomas.  [place]aja de [place] y no sabe por qu.  Tiene dificultad para tragar o siente dolor al D[employer] Restaurants.  Tiene sibilancias o tos que no desaparece.  Los sntomas no mejoran con Scientist, research (medical).  Tiene la voz ronca. SOLICITE AYUDA DE INMEDIATO SI:  Tiene dolor en los brazos, el cuello, los [place], la dentadura o la espalda.  Berenice Primas, se marea o tiene sensacin de desvanecimiento.  Siente falta de aire o Journalist, newspaper.  Vomita y el vmito es parecido a la sangre o a los granos de caf.  Pierde el conocimiento (se desmaya).  Las heces son sanguinolentas o de color negro.  No puede tragar, beber o comer. Esta informacin no tiene Theme park manager el consejo del mdico. Asegrese de hacerle al mdico cualquier pregunta que tenga. Document Released: 09/23/2010 Document Revised: 05/12/2015 Document Reviewed: 12/16/2014 Elsevier Interactive Patient Education  [employer].

## 2017-08-06 ENCOUNTER — Ambulatory Visit: Payer: Self-pay | Admitting: Surgery

## 2017-08-07 ENCOUNTER — Telehealth: Payer: Self-pay

## 2017-08-07 NOTE — Telephone Encounter (Signed)
Contacted patient and told her that she had missed her appointment with Dr. Aleen CampiPiscoya. She stated that she had been feeling well, therefore, she did not think to come back. She continues to take her PPI and it has worked with her abdominal pain. I told her that if she ever needed us again, to please call us back. Patient agreed and had no further questions.

## 2018-07-30 ENCOUNTER — Emergency Department: Payer: BLUE CROSS/BLUE SHIELD

## 2018-07-30 ENCOUNTER — Emergency Department
Admission: EM | Admit: 2018-07-30 | Discharge: 2018-07-30 | Disposition: A | Discharge status: Home / Self Care | Payer: BLUE CROSS/BLUE SHIELD | Attending: Student in an Organized Health Care Education/Training Program | Admitting: Student in an Organized Health Care Education/Training Program

## 2018-07-30 ENCOUNTER — Other Ambulatory Visit: Payer: Self-pay

## 2018-07-30 ENCOUNTER — Encounter: Payer: Self-pay | Admitting: Emergency Medicine

## 2018-07-30 DIAGNOSIS — R1013 Epigastric pain: Secondary | ICD-10-CM

## 2018-07-30 DIAGNOSIS — R102 Pelvic and perineal pain: Secondary | ICD-10-CM | POA: Diagnosis not present

## 2018-07-30 DIAGNOSIS — R109 Unspecified abdominal pain: Secondary | ICD-10-CM | POA: Diagnosis not present

## 2018-07-30 DIAGNOSIS — Z975 Presence of (intrauterine) contraceptive device: Secondary | ICD-10-CM | POA: Insufficient documentation

## 2018-07-30 DIAGNOSIS — Z79899 Other long term (current) drug therapy: Secondary | ICD-10-CM | POA: Diagnosis not present

## 2018-07-30 DIAGNOSIS — O26611 Liver and biliary tract disorders in pregnancy, first trimester: Secondary | ICD-10-CM | POA: Insufficient documentation

## 2018-07-30 DIAGNOSIS — O2631 Retained intrauterine contraceptive device in pregnancy, first trimester: Secondary | ICD-10-CM | POA: Diagnosis not present

## 2018-07-30 DIAGNOSIS — O99611 Diseases of the digestive system complicating pregnancy, first trimester: Secondary | ICD-10-CM | POA: Diagnosis not present

## 2018-07-30 DIAGNOSIS — O208 Other hemorrhage in early pregnancy: Secondary | ICD-10-CM | POA: Diagnosis not present

## 2018-07-30 DIAGNOSIS — K8020 Calculus of gallbladder without cholecystitis without obstruction: Secondary | ICD-10-CM

## 2018-07-30 DIAGNOSIS — Z3A01 Less than 8 weeks gestation of pregnancy: Secondary | ICD-10-CM | POA: Diagnosis not present

## 2018-07-30 DIAGNOSIS — O26891 Other specified pregnancy related conditions, first trimester: Secondary | ICD-10-CM | POA: Diagnosis not present

## 2018-07-30 DIAGNOSIS — K7689 Other specified diseases of liver: Secondary | ICD-10-CM | POA: Diagnosis not present

## 2018-07-30 DIAGNOSIS — Z3491 Encounter for supervision of normal pregnancy, unspecified, first trimester: Secondary | ICD-10-CM

## 2018-07-30 DIAGNOSIS — N939 Abnormal uterine and vaginal bleeding, unspecified: Secondary | ICD-10-CM

## 2018-07-30 LAB — COMPREHENSIVE METABOLIC PANEL
ALBUMIN: 4 g/dL (ref 3.5–5.0)
ALK PHOS: 74 U/L (ref 38–126)
ALT: 24 U/L (ref 0–44)
ANION GAP: 6 (ref 5–15)
AST: 22 U/L (ref 15–41)
BILIRUBIN TOTAL: 0.5 mg/dL (ref 0.3–1.2)
BUN: 13 mg/dL (ref 6–20)
CALCIUM: 8.9 mg/dL (ref 8.9–10.3)
CO2: 29 mmol/L (ref 22–32)
CREATININE: 0.54 mg/dL (ref 0.44–1.00)
Chloride: 103 mmol/L (ref 98–111)
GFR calc Af Amer: >60 mL/min (ref >60)
GFR calc non Af Amer: >60 mL/min (ref >60)
GLUCOSE: 108 mg/dL — AB (ref 70–99)
Potassium: 3.7 mmol/L (ref 3.5–5.1)
SODIUM: 138 mmol/L (ref 135–145)
Total Protein: 7.2 g/dL (ref 6.5–8.1)

## 2018-07-30 LAB — CBC WITH DIFFERENTIAL/PLATELET
Abs Immature Granulocytes: 0.04 10*3/uL (ref 0.00–0.07)
Basophils Absolute: 0.1 10*3/uL (ref 0.0–0.1)
Basophils Relative: 1 %
EOS PCT: 2 %
Eosinophils Absolute: 0.2 10*3/uL (ref 0.0–0.5)
HEMATOCRIT: 40 % (ref 36.0–46.0)
HEMOGLOBIN: 12.9 g/dL (ref 12.0–15.0)
Immature Granulocytes: 1 %
LYMPHS ABS: 3.1 10*3/uL (ref 0.7–4.0)
LYMPHS PCT: 36 %
MCH: 27.5 pg (ref 26.0–34.0)
MCHC: 32.3 g/dL (ref 30.0–36.0)
MCV: 85.3 fL (ref 80.0–100.0)
MONO ABS: 0.8 10*3/uL (ref 0.1–1.0)
Monocytes Relative: 9 %
NEUTROS ABS: 4.6 10*3/uL (ref 1.7–7.7)
Neutrophils Relative %: 51 %
Platelets: 253 10*3/uL (ref 150–400)
RBC: 4.69 MIL/uL (ref 3.87–5.11)
RDW: 13.3 % (ref 11.5–15.5)
WBC: 8.7 10*3/uL (ref 4.0–10.5)
nRBC: 0 % (ref 0.0–0.2)

## 2018-07-30 LAB — URINALYSIS, COMPLETE (UACMP) WITH MICROSCOPIC
Bilirubin Urine: NEGATIVE
Glucose, UA: NEGATIVE mg/dL
Ketones, ur: NEGATIVE mg/dL
NITRITE: NEGATIVE
PROTEIN: NEGATIVE mg/dL
SPECIFIC GRAVITY, URINE: 1.018 (ref 1.005–1.030)
pH: 7 (ref 5.0–8.0)

## 2018-07-30 LAB — POCT PREGNANCY, URINE: Preg Test, Ur: POSITIVE — AB

## 2018-07-30 LAB — HCG, QUANTITATIVE, PREGNANCY: HCG, BETA CHAIN, QUANT, S: 16655 m[IU]/mL — AB (ref <5)

## 2018-07-30 LAB — ABO/RH: ABO/RH(D): A NEG

## 2018-07-30 MED ORDER — RANITIDINE HCL 150 MG PO TABS: 150.0000 mg | ORAL_TABLET | Freq: Two times a day (BID) | ORAL | 1 refills | Status: DC

## 2018-07-30 MED ORDER — OXYCODONE HCL 5 MG PO TABS: 5.0000 mg | ORAL_TABLET | Freq: Two times a day (BID) | ORAL | 0 refills | Status: DC | PRN

## 2018-07-30 MED ORDER — SODIUM CHLORIDE 0.9 % IV BOLUS
500.0000 mL | Freq: Once | INTRAVENOUS | Status: AC
  Administered 2018-07-30: 500 mL via INTRAVENOUS

## 2018-07-30 MED ORDER — ONDANSETRON HCL 4 MG/2ML IJ SOLN
4.0000 mg | Freq: Once | INTRAMUSCULAR | Status: AC
  Administered 2018-07-30: 4 mg via INTRAVENOUS
  Filled 2018-07-30: qty 2

## 2018-07-30 MED ORDER — MORPHINE SULFATE (PF) 4 MG/ML IV SOLN
4.0000 mg | INTRAVENOUS | Status: DC | PRN
  Administered 2018-07-30: 4 mg via INTRAVENOUS
  Filled 2018-07-30: qty 1

## 2018-07-30 MED ORDER — ONDANSETRON 4 MG PO TBDP: 4.0000 mg | ORAL_TABLET | Freq: Three times a day (TID) | ORAL | 0 refills | Status: DC | PRN

## 2018-07-30 NOTE — ED Notes (Signed)
Interpreter requested to updated pt

## 2018-07-30 NOTE — ED Notes (Signed)
Pt to ultrasound

## 2018-07-30 NOTE — ED Triage Notes (Signed)
Patient ambulatory to triage with steady gait, without difficulty or distress noted; pt reports mid upper abd pain accomp by N/V

## 2018-07-30 NOTE — ED Provider Notes (Signed)
I assumed care of the patient.  Ultrasound findings reveal a gallbladder stone in the neck of the gallbladder.  She is also 5 weeks and 5 days pregnant.  Her pain is very well controlled at this point.  I will discharge her to with pain medicine to take as needed as well as antacids.  She has seen general surgery recently.  I will advise close outpatient follow-up with them.   Emily FilbertWilliams, Jonathan E, MD 07/30/18 [phone]

## 2018-07-30 NOTE — ED Provider Notes (Signed)
[facility] Emergency Department Provider Note    First MD Initiated Contact with Patient 07/30/18 0515     (approximate)  I have reviewed the triage vital signs and the nursing notes.   HISTORY  Chief Complaint Abdominal Pain    HPI Cheyenne Gomez is a 27 y.o. female with a known history of cholelithiasis with several medical encounters over the past month for symptomatic gallstones presents the ER with worsening epigastric abdominal pain as well as nausea and vomiting.  Denies any fevers.  States the pain is moderate to severe.  Has been evaluated by surgery there are trialing conservative management.  Denies any alleviating or aggravating factors at this point.    Past Medical History:  Diagnosis Date  . Calculus of gallbladder with biliary obstruction but without cholecystitis    No family history on file. History reviewed. No pertinent surgical history. Patient Active Problem List   Diagnosis Date Noted  . Calculus of gallbladder with biliary obstruction but without cholecystitis       Prior to Admission medications   Medication Sig Start Date End Date Taking? Authorizing Provider  dicyclomine (BENTYL) 20 MG tablet Take 1 tablet (20 mg total) by mouth 3 (three) times daily as needed for spasms. 06/07/17 06/07/18  Merrily Brittle, MD  ibuprofen (ADVIL,MOTRIN) 600 MG tablet Take 1 tablet (600 mg total) by mouth every 8 (eight) hours as needed. 06/07/17   Merrily Brittle, MD  omeprazole (PRILOSEC) 40 MG capsule Take 1 capsule (40 mg total) by mouth daily. 07/04/17   Piscoya, Elita Quick, MD  ondansetron (ZOFRAN ODT) 4 MG disintegrating tablet Take 1 tablet (4 mg total) by mouth every 8 (eight) hours as needed for nausea or vomiting. 06/07/17   Merrily Brittle, MD    Allergies Patient has no known allergies.    Social History Social History   Tobacco Use  . Smoking status: Never Smoker  . Smokeless tobacco: Never Used  Substance Use Topics    . Alcohol use: No  . Drug use: No    Review of Systems Patient denies headaches, rhinorrhea, blurry vision, numbness, shortness of breath, chest pain, edema, cough, abdominal pain, nausea, vomiting, diarrhea, dysuria, fevers, rashes or hallucinations unless otherwise stated above in HPI. ____________________________________________   PHYSICAL EXAM:  VITAL SIGNS: Vitals:   07/30/18 0556 07/30/18 0600  BP: 106/63 105/60  Pulse: 64 68  Resp: 14 16  Temp:    SpO2: 99% 99%    Constitutional: Alert and oriented.  Eyes: Conjunctivae are normal.  Head: Atraumatic. Nose: No congestion/rhinnorhea. Mouth/Throat: Mucous membranes are moist.   Neck: No stridor. Painless ROM.  Cardiovascular: Normal rate, regular rhythm. Grossly normal heart sounds.  Good peripheral circulation. Respiratory: Normal respiratory effort.  No retractions. Lungs CTAB. Gastrointestinal: Soft with mild ruq ttp. No distention. No abdominal bruits. No CVA tenderness. Genitourinary: deferred Musculoskeletal: No lower extremity tenderness nor edema.  No joint effusions. Neurologic:  Normal speech and language. No gross focal neurologic deficits are appreciated. No facial droop Skin:  Skin is warm, dry and intact. No rash noted. Psychiatric: Mood and affect are normal. Speech and behavior are normal.  ____________________________________________   LABS (all labs ordered are listed, but only abnormal results are displayed)  Results for orders placed or performed during the hospital encounter of 07/30/18 (from the past 24 hour(s))  CBC with Differential/Platelet     Status: None   Collection Time: 07/30/18  5:35 AM  Result Value Ref Range   WBC  8.7 4.0 - 10.5 K/uL   RBC 4.69 3.87 - 5.11 MIL/uL   Hemoglobin 12.9 12.0 - 15.0 g/dL   HCT 16.1 09.6 - 04.5 %   MCV 85.3 80.0 - 100.0 fL   MCH 27.5 26.0 - 34.0 pg   MCHC 32.3 30.0 - 36.0 g/dL   RDW 40.9 81.1 - 91.4 %   Platelets 253 150 - 400 K/uL   nRBC 0.0 0.0 -  0.2 %   Neutrophils Relative % 51 %   Neutro Abs 4.6 1.7 - 7.7 K/uL   Lymphocytes Relative 36 %   Lymphs Abs 3.1 0.7 - 4.0 K/uL   Monocytes Relative 9 %   Monocytes Absolute 0.8 0.1 - 1.0 K/uL   Eosinophils Relative 2 %   Eosinophils Absolute 0.2 0.0 - 0.5 K/uL   Basophils Relative 1 %   Basophils Absolute 0.1 0.0 - 0.1 K/uL   Immature Granulocytes 1 %   Abs Immature Granulocytes 0.04 0.00 - 0.07 K/uL  Comprehensive metabolic panel     Status: Abnormal   Collection Time: 07/30/18  5:35 AM  Result Value Ref Range   Sodium 138 135 - 145 mmol/L   Potassium 3.7 3.5 - 5.1 mmol/L   Chloride 103 98 - 111 mmol/L   CO2 29 22 - 32 mmol/L   Glucose, Bld 108 (H) 70 - 99 mg/dL   BUN 13 6 - 20 mg/dL   Creatinine, Ser 7.82 0.44 - 1.00 mg/dL   Calcium 8.9 8.9 - 95.6 mg/dL   Total Protein 7.2 6.5 - 8.1 g/dL   Albumin 4.0 3.5 - 5.0 g/dL   AST 22 15 - 41 U/L   ALT 24 0 - 44 U/L   Alkaline Phosphatase 74 38 - 126 U/L   Total Bilirubin 0.5 0.3 - 1.2 mg/dL   GFR calc non Af Amer >60 >60 mL/min   GFR calc Af Amer >60 >60 mL/min   Anion gap 6 5 - 15  Pregnancy, urine POC     Status: Abnormal   Collection Time: 07/30/18  6:48 AM  Result Value Ref Range   Preg Test, Ur POSITIVE (A) NEGATIVE   ____________________________________________ ____________________________________________  RADIOLOGY  I personally reviewed all radiographic images ordered to evaluate for the above acute complaints and reviewed radiology reports and findings.  These findings were personally discussed with the patient.  Please see medical record for radiology report.  ____________________________________________   PROCEDURES  Procedure(s) performed:  Procedures    Critical Care performed: no ____________________________________________   INITIAL IMPRESSION / ASSESSMENT AND PLAN / ED COURSE  Pertinent labs & imaging results that were available during my care of the patient were reviewed by me and considered in  my medical decision making (see chart for details).   DDX: cholelithiasis, cholecystitis, enteritis, gastritis, pancreatitis  Cheyenne Gomez is a 27 y.o. who presents to the ED with symptoms as described above.  Patient initially complaining of pain consistent with her known cholelithiasis.  Blood work is reassuring shows no evidence of infectious process or biliary obstruction.  Subsequently during additional testing patient found to be pregnant.  She does have IUD in place.  States that she was having some vaginal bleeding as well as a left sided pelvic pain last week.  Based on these findings patient will need Rh and beta quant as well as transvaginal ultrasound to exclude ectopic.  Patient will be signed out to oncoming physician pending those results.      As part of  my medical decision making, I reviewed the following data within the electronic MEDICAL RECORD NUMBER Nursing notes reviewed and incorporated, Labs reviewed, notes from prior ED visits.  ____________________________________________   FINAL CLINICAL IMPRESSION(S) / ED DIAGNOSES  Final diagnoses:  Epigastric pain  Vaginal bleeding      NEW MEDICATIONS STARTED DURING THIS VISIT:  New Prescriptions   No medications on file     Note:  This document was prepared using Dragon voice recognition software and may include unintentional dictation errors.    Willy Eddyobinson, Sindia Kowalczyk, MD 07/30/18 [phone]

## 2018-07-30 NOTE — ED Notes (Signed)
Dr. Robinson at the bedside for pt evaluation  

## 2018-08-05 ENCOUNTER — Encounter: Payer: Self-pay | Admitting: Obstetrics and Gynecology

## 2018-08-05 ENCOUNTER — Ambulatory Visit (INDEPENDENT_AMBULATORY_CARE_PROVIDER_SITE_OTHER): Payer: BLUE CROSS/BLUE SHIELD | Admitting: Obstetrics and Gynecology

## 2018-08-05 ENCOUNTER — Other Ambulatory Visit (HOSPITAL_COMMUNITY)
Admission: RE | Admit: 2018-08-05 | Discharge: 2018-08-05 | Disposition: A | Discharge status: Home / Self Care | Payer: BLUE CROSS/BLUE SHIELD | Source: Ambulatory Visit | Attending: Obstetrics and Gynecology | Admitting: Obstetrics and Gynecology

## 2018-08-05 VITALS — BP 124/68 | HR 82 | Ht 63.0 in | Wt 211.0 lb

## 2018-08-05 DIAGNOSIS — Z3480 Encounter for supervision of other normal pregnancy, unspecified trimester: Secondary | ICD-10-CM | POA: Insufficient documentation

## 2018-08-05 DIAGNOSIS — Z30432 Encounter for removal of intrauterine contraceptive device: Secondary | ICD-10-CM

## 2018-08-05 DIAGNOSIS — Z3689 Encounter for other specified antenatal screening: Secondary | ICD-10-CM

## 2018-08-05 DIAGNOSIS — Z124 Encounter for screening for malignant neoplasm of cervix: Secondary | ICD-10-CM | POA: Diagnosis not present

## 2018-08-05 DIAGNOSIS — Z3A01 Less than 8 weeks gestation of pregnancy: Secondary | ICD-10-CM

## 2018-08-05 DIAGNOSIS — O3680X0 Pregnancy with inconclusive fetal viability, not applicable or unspecified: Secondary | ICD-10-CM

## 2018-08-05 LAB — OB RESULTS CONSOLE VARICELLA ZOSTER ANTIBODY, IGG: Varicella: IMMUNE

## 2018-08-05 NOTE — Progress Notes (Signed)
New Obstetric Patient H&P    Chief Complaint: "Desires prenatal care"   History of Present Illness: Patient is a 27 y.o. G37P3003 Hispanic or Latino female, presents with amenorrhea and positive home pregnancy test. Patient's last menstrual period was 06/15/2018. and based on her  LMP, her EDD is Estimated Date of Delivery: 03/21/2018 which is inconsistent with recent ER ultrasound which revealed a [redacted]w[redacted]d intrauterine gestational sac, yolk sac, but no fetal pole.   The patient conceived with a ParaGard IUD.  Cycles are regular monthly.    She had a urine pregnancy test which was positive 1 week(s)  ago. Her last menstrual period was normal. Since her LMP she claims she has experienced fatigue, breast tenderness. She has had some spotting, was not covered with rhogam in ER but at current gestational age would not expect expression of Rh on fetal RBC.  This is an unintended but desired pregnancy Her past medical history is noncontributory. Her prior pregnancies are notable for none  Since her LMP, she admits to the use of tobacco products  no She claims she has gained   no pounds since the start of her pregnancy.  There are cats in the home in the home  no  She admits close contact with children on a regular basis  yes  She has had chicken pox in the past yes She has had Tuberculosis exposures, symptoms, or previously tested positive for TB   no Current or past history of domestic violence. no  Genetic Screening/Teratology Counseling: (Includes patient, baby's father, or anyone in either family with:)   1. Patient's age >/= 72 at [facility]  no 2. Thalassemia ([place], [place], Mediterranean, or Asian background): MCV<80  no 3. Neural tube defect (meningomyelocele, spina bifida, anencephaly)  no 4. Congenital heart defect  no  5. Down syndrome  no 6. Tay-Sachs (Jewish, [place]  no 7. Canavan's Disease  no 8. Sickle cell disease or trait (African)  no  9. Hemophilia or other blood disorders   no  10. Muscular dystrophy  no  11. Cystic fibrosis  no  12. Huntington's Chorea  no  13. Mental retardation/autism  no 14. Other inherited genetic or chromosomal disorder  no 15. Maternal metabolic disorder (DM, PKU, etc)  no 16. Patient or FOB with a child with a birth defect not listed above no  16a. Patient or FOB with a birth defect themselves no 17. Recurrent pregnancy loss, or stillbirth  no  18. Any medications since LMP other than prenatal vitamins (include vitamins, supplements, OTC meds, drugs, alcohol)  no 19. Any other genetic/environmental exposure to discuss  no  Infection History:   1. Lives with someone with TB or TB exposed  no  2. Patient or partner has history of genital herpes  no 3. Rash or viral illness since LMP  no 4. History of STI (GC, CT, HPV, syphilis, HIV)  no 5. History of recent travel :  no  Other pertinent information:  no     Review of Systems:10 point review of systems negative unless otherwise noted in HPI  Past Medical History:  Past Medical History:  Diagnosis Date  . Calculus of gallbladder with biliary obstruction but without cholecystitis     Past Surgical History:  History reviewed. No pertinent surgical history.  Gynecologic History: Patient's last menstrual period was 06/15/2018.  Obstetric History: [id number]  Family History:  History reviewed. No pertinent family history.  Social History:  Social History   Socioeconomic History  .  Marital status: Single    Spouse name: Not on file  . Number of children: Not on file  . Years of education: Not on file  . Highest education level: Not on file  Occupational History  . Not on file  Social Needs  . Financial resource strain: Not on file  . Food insecurity:    Worry: Not on file    Inability: Not on file  . Transportation needs:    Medical: Not on file    Non-medical: Not on file  Tobacco Use  . Smoking status: Never Smoker  . Smokeless tobacco: Never Used  Substance  and Sexual Activity  . Alcohol use: No  . Drug use: No  . Sexual activity: Yes  Lifestyle  . Physical activity:    Days per week: Not on file    Minutes per session: Not on file  . Stress: Not on file  Relationships  . Social connections:    Talks on phone: Not on file    Gets together: Not on file    Attends religious service: Not on file    Active member of club or organization: Not on file    Attends meetings of clubs or organizations: Not on file    Relationship status: Not on file  . Intimate partner violence:    Fear of current or ex partner: Not on file    Emotionally abused: Not on file    Physically abused: Not on file    Forced sexual activity: Not on file  Other Topics Concern  . Not on file  Social History Narrative  . Not on file    Allergies:  No Known Allergies  Medications: Prior to Admission medications   Medication Sig Start Date End Date Taking? Authorizing Provider  ondansetron (ZOFRAN ODT) 4 MG disintegrating tablet Take 1 tablet (4 mg total) by mouth every 8 (eight) hours as needed for nausea or vomiting. Patient not taking: Reported on 08/05/2018 06/07/17   Merrily Brittle, MD  oxyCODONE (ROXICODONE) 5 MG immediate release tablet Take 1 tablet (5 mg total) by mouth 2 (two) times daily as needed for severe pain. Patient not taking: Reported on 08/05/2018 07/30/18   Emily Filbert, MD  ranitidine (ZANTAC) 150 MG tablet Take 1 tablet (150 mg total) by mouth 2 (two) times daily. Patient not taking: Reported on 08/05/2018 07/30/18 07/30/19  Emily Filbert, MD    Physical Exam Vitals: Blood pressure 124/68, pulse 82, height 5\' 3"  (1.6 m), weight 211 lb (95.7 kg), last menstrual period 06/15/2018.  General: NAD HEENT: normocephalic, anicteric Thyroid: no enlargement, no palpable nodules Pulmonary: No increased work of breathing, CTAB Cardiovascular: RRR, distal pulses 2+ Abdomen: NABS, soft, non-tender, non-distended.  Umbilicus without  lesions.  No hepatomegaly, splenomegaly or masses palpable. No evidence of hernia  Genitourinary:  External: Normal external female genitalia.  Normal urethral meatus, normal  Bartholin's and Skene's glands.    Vagina: Normal vaginal mucosa, no evidence of prolapse.    Cervix: Grossly normal in appearance, no bleeding  Uterus:  Non-enlarged, mobile, normal contour.  No CMT, IUD string visualized  Adnexa: ovaries non-enlarged, no adnexal masses  Rectal: deferred Extremities: no edema, erythema, or tenderness Neurologic: Grossly intact Psychiatric: mood appropriate, affect full    GYNECOLOGY OFFICE PROCEDURE NOTE  Lilly Gasser is a 27 y.o. [phone] here for  IUD removal secondary to early first trimester pregnancy.  IUD Removal  Patient identified, informed consent performed, consent signed.  Patient was in  the dorsal lithotomy position, normal external genitalia was noted.  A speculum was placed in the patient's vagina, normal discharge was noted, no lesions. The cervix was visualized, no lesions, no abnormal discharge.  The strings of the IUD were grasped and pulled using ring forceps. The IUD was removed in its entirety.  Patient tolerated the procedure well.    Assessment: 27 y.o. [id number] at Unknown presenting to initiate prenatal care  Plan: 1) Avoid alcoholic beverages. 2) Patient encouraged not to smoke.  3) Discontinue the use of all non-medicinal drugs and chemicals.  4) Take prenatal vitamins daily.  5) Nutrition, food safety (fish, cheese advisories, and high nitrite foods) and exercise discussed. 6) Hospital and practice style discussed with cross coverage system.  7) Genetic Screening, such as with 1st Trimester Screening, cell free fetal DNA, AFP testing, and Ultrasound, as well as with amniocentesis and CVS as appropriate, is discussed with patient. At the conclusion of today's visit patient undecided genetic testing 8) Patient is asked about travel to areas at  risk for the [place] virus, and counseled to avoid travel and exposure to mosquitoes or sexual partners who may have themselves been exposed to the virus. Testing is discussed, and will be ordered as appropriate.   Vena AustriaAndreas Juanitta Earnhardt, MD, Evern CoreFACOG [facility], [facility] 08/07/2018, 8:45 AM

## 2018-08-06 LAB — RPR+RH+ABO+RUB AB+AB SCR+CB...
ANTIBODY SCREEN: NEGATIVE
HEMATOCRIT: 38.3 % (ref 34.0–46.6)
HIV SCREEN 4TH GENERATION: NONREACTIVE
Hemoglobin: 12.7 g/dL (ref 11.1–15.9)
Hepatitis B Surface Ag: NEGATIVE
MCH: 27.3 pg (ref 26.6–33.0)
MCHC: 33.2 g/dL (ref 31.5–35.7)
MCV: 82 fL (ref 79–97)
Platelets: 254 10*3/uL (ref 150–450)
RBC: 4.66 x10E6/uL (ref 3.77–5.28)
RDW: 13.8 % (ref 12.3–15.4)
RPR: NONREACTIVE
RUBELLA: 14.4 {index} (ref >0.99)
Rh Factor: NEGATIVE
Varicella zoster IgG: 644 index (ref >165)
WBC: 11.5 10*3/uL — ABNORMAL HIGH (ref 3.4–10.8)

## 2018-08-14 LAB — CYTOLOGY - PAP
DIAGNOSIS: NEGATIVE
HPV 16/18/45 GENOTYPING: NEGATIVE
HPV: DETECTED — AB

## 2018-08-15 ENCOUNTER — Ambulatory Visit (INDEPENDENT_AMBULATORY_CARE_PROVIDER_SITE_OTHER): Payer: BLUE CROSS/BLUE SHIELD

## 2018-08-15 ENCOUNTER — Encounter: Payer: Self-pay | Admitting: Obstetrics and Gynecology

## 2018-08-15 ENCOUNTER — Other Ambulatory Visit: Payer: Self-pay | Admitting: Obstetrics and Gynecology

## 2018-08-15 ENCOUNTER — Ambulatory Visit (INDEPENDENT_AMBULATORY_CARE_PROVIDER_SITE_OTHER): Payer: BLUE CROSS/BLUE SHIELD | Admitting: Obstetrics and Gynecology

## 2018-08-15 VITALS — BP 110/68 | Wt 207.0 lb

## 2018-08-15 DIAGNOSIS — Z3480 Encounter for supervision of other normal pregnancy, unspecified trimester: Secondary | ICD-10-CM

## 2018-08-15 DIAGNOSIS — Z3A01 Less than 8 weeks gestation of pregnancy: Secondary | ICD-10-CM

## 2018-08-15 DIAGNOSIS — Z3687 Encounter for antenatal screening for uncertain dates: Secondary | ICD-10-CM

## 2018-08-15 DIAGNOSIS — Z3689 Encounter for other specified antenatal screening: Secondary | ICD-10-CM

## 2018-08-15 LAB — POCT URINALYSIS DIPSTICK OB: Glucose, UA: NEGATIVE

## 2018-08-15 NOTE — Progress Notes (Signed)
ROB/Dating C/o some nausea Declines flu shot

## 2018-08-15 NOTE — Progress Notes (Signed)
Routine Prenatal Care Visit  Delayed chart closure.  Subjective  Cheyenne Gomez is a 27 y.o. [phone] at [redacted]w[redacted]d being seen today for ongoing prenatal care.  She is currently monitored for the following issues for this low-risk pregnancy and has Calculus of gallbladder with biliary obstruction but without cholecystitis; Supervision of high-risk pregnancy; Rh negative state in antepartum period; Obesity in pregnancy, antepartum; Pyelonephritis affecting pregnancy in second trimester; Gestational diabetes mellitus (GDM) affecting fourth pregnancy; Macrosomia; Encounter for planned induction of labor; Postpartum care following vaginal delivery; Encounter for sterilization; and Vaginal delivery on their problem list.  ----------------------------------------------------------------------------------- Patient reports no complaints.   Reports normal fetal movement. Denies contractions. Denies vaginal bleeding.  Denies leaking of fluid.  ----------------------------------------------------------------------------------- The following portions of the patient's history were reviewed and updated as appropriate: allergies, current medications, past family history, past medical history, past social history, past surgical history and problem list. Problem list updated.   Objective  Blood pressure 110/68, weight 207 lb (93.9 kg), last menstrual period 06/15/2018, unknown if currently breastfeeding. Pregravid weight 207 lb (93.9 kg) Total Weight Gain 0 lb (0 kg) Urinalysis:      Fetal Status: heart tones present  General:  Alert, oriented and cooperative. Patient is in no acute distress.  Skin: Skin is warm and dry. No rash noted.   Cardiovascular: Normal heart rate noted  Respiratory: Normal respiratory effort, no problems with respiration noted  Abdomen: Soft, gravid, appropriate for gestational age.       Pelvic:  Cervical exam deferred        Extremities: Normal range of motion.     Mental  Status: Normal mood and affect. Normal behavior. Normal judgment and thought content.     Assessment   27 y.o. [id number] at [redacted]w[redacted]d by  03/28/2019, by Ultrasound presenting for routine prenatal visit  Plan   Pregnancy #5 Problems (from 08/15/18 to 06/13/19)    Problem Noted Resolved   Encounter for sterilization 03/22/2019 by Cheyenne Novak, MD No   Postpartum care following vaginal delivery 03/21/2019 by Cheyenne Gomez, CNM No   Gestational diabetes mellitus (GDM) affecting fourth pregnancy 01/15/2019 by Cheyenne Milch, MD No   Overview Signed 01/15/2019 11:17 AM by Cheyenne Milch, MD    Current Diabetic Medications:  None  [ ]  Aspirin 81 mg daily after 12 weeks; discontinue after 36 weeks (? A2/B GDM)  Required Referrals for A1GDM or A2GDM: [ ]  Diabetes Education and Testing Supplies [ ]  Nutrition Cousult  For A2/B GDM or higher classes of DM [ ]  Diabetes Education and Testing Supplies [ ]  Nutrition Counsult [ ]  Fetal ECHO after 22-24 weeks  [ ]  Eye exam for retina evaluation  [ ]  Baseline EKG [ ]  US fetal growth every 4 weeks starting at 28 weeks [ ]  Twice weekly NST starting at [redacted] weeks gestation [ ]  Delivery planning contingent on fetal growth, AFI, glycemic control, and other co-morbidities but at least by 39 weeks  Baseline and surveillance labs (pulled in from [facility], refresh links as needed)  Lab Results  Component Value Date   CREATININE 0.51 11/04/2018   AST 22 07/30/2018   ALT 24 07/30/2018   LABPROT 12.6 11/04/2018   Lab Results  Component Value Date   HGBA1C 5.5 10/14/2018    Antenatal Testing Class of DM U/S NST/AFI DELIVERY  Diabetes   A1 - good control - O24.410    A2 - good control - O24.419      A2  -  poor control or poor compliance - O24.419, E11.65   (Macrosomia or polyhydramnios) **E11.65 is extra code for poor control**    A2/B - O24.919  and B-C O24.319  Poor control B-C or D-Gomez-F-T - O24.319  or  Type I DM - O24.019   20-38  20-38  20-24-28-32-36   20-24-28-32-35-38//fetal echo  20-24-27-30-33-36-38//fetal echo  40  32//2 x wk  32//2 x wk   32//2 x wk  28//BPP wkly then 32//2 x wk  40  39  PRN   39  PRN          Obesity in pregnancy, antepartum 09/12/2018 by Cheyenne MilchSchuman, Cheyenne Starn R, MD No   Supervision of high-risk pregnancy 08/27/2018 by Cheyenne Gomez, Andreas, MD No   Overview Addendum 02/04/2019  8:27 AM by Cheyenne Gomez, Cheyenne, CNM    Clinic [facility]ren[facility]  Dating 7 week US Blood type: A negative  Genetic Screen Declines Antibody: negative  Anatomic US complete Rubella: Immune Varicella: Immune  GTT Early: hgbA1C 5.5 Third trimester: ELEVATED 3hr (100/221/119/102 RPR: NR  Rhogam  [x ] 28 weeks-01/06/2019 HBsAg: negative  TDaP vaccine    01/22/2019                    Flu Shot: 09/12/2018 HIV: negative  Baby Food  Breast- experienced                              GBS:   Contraception  Desires tubal, understands may not be possible with COVID-19 outbreak Conceived this pregnancy with IUD- consent signed 5/26 Pap: 08/05/2018 NIL HPV positive  CBB     CS/VBAC  hx vaginal deliveries  pyelo in pregnancy  Support Person            Previous Version   Rh negative state in antepartum period 08/27/2018 by Cheyenne Gomez, Andreas, MD No       Gestational age appropriate obstetric precautions including but not limited to vaginal bleeding, contractions, leaking of fluid and fetal movement were reviewed in detail with the patient.    Return in about 4 weeks (around 09/12/2018) for ROB, interrpreter needed.   Discussed pap in 1  Year Not spotting or bleeding [facility] Discussed sterilization postpartum Declines screening for Downs syndrome.   Cheyenne Idlerhristanna Zeah Germano MD [facility], [facility] 08/15/18 4:03 PM

## 2018-08-27 ENCOUNTER — Encounter: Payer: Self-pay | Admitting: Obstetrics and Gynecology

## 2018-08-27 DIAGNOSIS — O26899 Other specified pregnancy related conditions, unspecified trimester: Secondary | ICD-10-CM

## 2018-08-27 DIAGNOSIS — Z6791 Unspecified blood type, Rh negative: Secondary | ICD-10-CM | POA: Insufficient documentation

## 2018-08-27 DIAGNOSIS — O0990 Supervision of high risk pregnancy, unspecified, unspecified trimester: Secondary | ICD-10-CM | POA: Insufficient documentation

## 2018-09-04 DIAGNOSIS — N12 Tubulo-interstitial nephritis, not specified as acute or chronic: Secondary | ICD-10-CM

## 2018-09-04 HISTORY — DX: Tubulo-interstitial nephritis, not specified as acute or chronic: N12

## 2018-09-04 NOTE — L&D Delivery Note (Signed)
Date of delivery: 03/21/2019 Estimated Date of Delivery: 03/28/19 Patient's last menstrual period was 06/15/2018. EGA: [redacted]w[redacted]d  Delivery Note At 4:14 PM a viable female was delivered via Vaginal, Spontaneous (Presentation: OA;  LOA).  APGAR: 9, 9; weight 9 lb 3.4 oz (4180 g).   Placenta status: spontaneous, intact.  Cord:  with the following complications: short.  Cord pH: NA  Called to see patient.  Mom pushed to deliver a viable female infant.  The head followed by shoulders, which delivered without difficulty, and the rest of the body.  No nuchal cord noted.  Baby to mom's chest.  Cord clamped and cut after 3 min delay.  Cord blood obtained.  Placenta delivered spontaneously, intact, with a 3-vessel cord.  Perineum intact/no other lacerations or abrasions noted.  All counts correct.  Hemostasis obtained with IV pitocin and fundal massage.    Anesthesia: none  Episiotomy: None Lacerations: None Suture Repair: NA Est. Blood Loss (mL):  200  Mom to postpartum.  Baby to Couplet care / Skin to Skin.  Rod Can 03/21/2019, 5:24 PM

## 2018-09-12 ENCOUNTER — Encounter: Payer: Self-pay | Admitting: Obstetrics and Gynecology

## 2018-09-12 ENCOUNTER — Ambulatory Visit (INDEPENDENT_AMBULATORY_CARE_PROVIDER_SITE_OTHER): Payer: BLUE CROSS/BLUE SHIELD | Admitting: Obstetrics and Gynecology

## 2018-09-12 VITALS — BP 108/64 | Wt 207.0 lb

## 2018-09-12 DIAGNOSIS — R8290 Unspecified abnormal findings in urine: Secondary | ICD-10-CM

## 2018-09-12 DIAGNOSIS — Z3480 Encounter for supervision of other normal pregnancy, unspecified trimester: Secondary | ICD-10-CM

## 2018-09-12 DIAGNOSIS — Z23 Encounter for immunization: Secondary | ICD-10-CM | POA: Diagnosis not present

## 2018-09-12 DIAGNOSIS — O99210 Obesity complicating pregnancy, unspecified trimester: Secondary | ICD-10-CM

## 2018-09-12 DIAGNOSIS — O99211 Obesity complicating pregnancy, first trimester: Secondary | ICD-10-CM

## 2018-09-12 DIAGNOSIS — Z3A11 11 weeks gestation of pregnancy: Secondary | ICD-10-CM

## 2018-09-12 DIAGNOSIS — Z6791 Unspecified blood type, Rh negative: Secondary | ICD-10-CM

## 2018-09-12 DIAGNOSIS — O26899 Other specified pregnancy related conditions, unspecified trimester: Secondary | ICD-10-CM

## 2018-09-12 DIAGNOSIS — O26891 Other specified pregnancy related conditions, first trimester: Secondary | ICD-10-CM

## 2018-09-12 NOTE — Progress Notes (Signed)
    Routine Prenatal Care Visit  Subjective  Cheyenne Gomez is a 28 y.o. G5P3003 at 8829w6d being seen today for ongoing prenatal care.  She is currently monitored for the following issues for this low-risk pregnancy and has Calculus of gallbladder with biliary obstruction but without cholecystitis; Supervision of other normal pregnancy, antepartum; Rh negative state in antepartum period; and Obesity in pregnancy, antepartum on their problem list.  ----------------------------------------------------------------------------------- Patient reports no complaints.   Contractions: Not present. Vag. Bleeding: None.  Movement: Absent. Denies leaking of fluid.  ----------------------------------------------------------------------------------- The following portions of the patient's history were reviewed and updated as appropriate: allergies, current medications, past family history, past medical history, past social history, past surgical history and problem list. Problem list updated.   Objective  Blood pressure 108/64, weight 207 lb (93.9 kg), last menstrual period 06/15/2018. Pregravid weight 207 lb (93.9 kg) Total Weight Gain 0 lb (0 kg) Urinalysis:      Fetal Status: Fetal Heart Rate (bpm): 169   Movement: Absent     General:  Alert, oriented and cooperative. Patient is in no acute distress.  Skin: Skin is warm and dry. No rash noted.   Cardiovascular: Normal heart rate noted  Respiratory: Normal respiratory effort, no problems with respiration noted  Abdomen: Soft, gravid, appropriate for gestational age. Pain/Pressure: Present     Pelvic:  Cervical exam deferred        Extremities: Normal range of motion.     Mental Status: Normal mood and affect. Normal behavior. Normal judgment and thought content.     Assessment   28 y.o. G5P3003 at 7929w6d by  03/28/2019, by Ultrasound presenting for routine prenatal visit  Plan   Pregnancy #4 Problems (from 08/15/18 to present)    Problem Noted Resolved   Obesity in pregnancy, antepartum 09/12/2018 by Natale MilchSchuman, Christanna R, MD No   Supervision of other normal pregnancy, antepartum 08/27/2018 by Vena AustriaStaebler, Andreas, MD No   Overview Addendum 09/12/2018  5:34 PM by Natale MilchSchuman, Christanna R, MD    Clinic [facility] P[facility]atal [facility]  Dating 7 week US Blood type: A negative  Genetic Screen Declines Antibody: negative  Anatomic US  Rubella: Immune Varicella: Immune  GTT Early:               Third trimester:  RPR: NR  Rhogam  [ ]  28 weeks HBsAg: negative  TDaP vaccine                        Flu Shot: 09/12/2018 HIV: negative  Baby Food                                GBS:   Contraception  Pap: 08/05/2018 NIL HPV positive  CBB     CS/VBAC    Support Person            Rh negative state in antepartum period 08/27/2018 by Vena AustriaStaebler, Andreas, MD No       Gestational age appropriate obstetric precautions including but not limited to vaginal bleeding, contractions, leaking of fluid and fetal movement were reviewed in detail with the patient.    Reassurance about cramping, encouraged oral hydration. Asked to report any bleeding.  Lab closed, hgba1c at next visit Declines genetic testing   Return in about 4 weeks (around 10/10/2018) for rob.  Natale Milchhristanna R Schuman MD [facility], [facility] 09/12/2018, 5:38 PM

## 2018-09-12 NOTE — Progress Notes (Signed)
ROB C/o cramping Flu shot today

## 2018-09-13 LAB — POCT URINALYSIS DIPSTICK
Bilirubin, UA: NEGATIVE
Blood, UA: NEGATIVE
Glucose, UA: NEGATIVE
NITRITE UA: NEGATIVE
PH UA: 6.5 (ref 5.0–8.0)
PROTEIN UA: POSITIVE — AB
Spec Grav, UA: 1.015 (ref 1.010–1.025)
Urobilinogen, UA: 0.2 E.U./dL

## 2018-09-13 NOTE — Addendum Note (Signed)
Addended by: Donnetta Hail on: 09/13/2018 10:38 AM   Modules accepted: Orders

## 2018-10-11 ENCOUNTER — Encounter: Payer: BLUE CROSS/BLUE SHIELD | Admitting: Maternal Newborn

## 2018-10-14 ENCOUNTER — Ambulatory Visit (INDEPENDENT_AMBULATORY_CARE_PROVIDER_SITE_OTHER): Payer: BLUE CROSS/BLUE SHIELD | Admitting: Certified Nurse Midwife

## 2018-10-14 VITALS — BP 104/60 | Wt 208.0 lb

## 2018-10-14 DIAGNOSIS — Z3480 Encounter for supervision of other normal pregnancy, unspecified trimester: Secondary | ICD-10-CM

## 2018-10-14 DIAGNOSIS — Z363 Encounter for antenatal screening for malformations: Secondary | ICD-10-CM

## 2018-10-14 DIAGNOSIS — Z3A16 16 weeks gestation of pregnancy: Secondary | ICD-10-CM

## 2018-10-14 DIAGNOSIS — Z3482 Encounter for supervision of other normal pregnancy, second trimester: Secondary | ICD-10-CM

## 2018-10-14 LAB — POCT URINALYSIS DIPSTICK OB
GLUCOSE, UA: NEGATIVE
PROTEIN: NEGATIVE

## 2018-10-14 NOTE — Progress Notes (Signed)
ROB

## 2018-10-15 LAB — HEMOGLOBIN A1C
ESTIMATED AVERAGE GLUCOSE: 111 mg/dL
Hgb A1c MFr Bld: 5.5 % (ref 4.8–5.6)

## 2018-10-15 NOTE — Progress Notes (Signed)
Could you please call this patient and let her know that this lab was normal? Thank you,  Dr. Jerene Pitch

## 2018-10-15 NOTE — Progress Notes (Signed)
ROB at 16wk3d: Reports having 4 hours of contraction type pain over the weekend. The intermittent pain started in her back and radiated around to the lower abdomen. No pain today. No vaginal bleeding. No dysuria. Feeling fetal movement FH at U-3. FHT 156 Long dipstick was remarkable only for moderate to large ketones. No blood or leukocytes or nitrites Hemoglobin A1C today Encourage increased fluids Anatomy scan and ROB in 3-4 weeks. Farrel Conners, CNM

## 2018-10-15 NOTE — Progress Notes (Signed)
Pt aware.

## 2018-11-04 ENCOUNTER — Encounter: Payer: Self-pay | Admitting: Advanced Practice Midwife

## 2018-11-04 ENCOUNTER — Ambulatory Visit (INDEPENDENT_AMBULATORY_CARE_PROVIDER_SITE_OTHER): Payer: BLUE CROSS/BLUE SHIELD | Admitting: Obstetrics and Gynecology

## 2018-11-04 ENCOUNTER — Other Ambulatory Visit: Payer: Self-pay

## 2018-11-04 ENCOUNTER — Observation Stay
Admission: AD | Admit: 2018-11-04 | Discharge: 2018-11-05 | Disposition: A | Discharge status: Home / Self Care | Payer: BLUE CROSS/BLUE SHIELD | Source: Ambulatory Visit | Attending: Obstetrics and Gynecology | Admitting: Obstetrics and Gynecology

## 2018-11-04 VITALS — BP 120/64 | Wt 205.0 lb

## 2018-11-04 DIAGNOSIS — Z331 Pregnant state, incidental: Secondary | ICD-10-CM

## 2018-11-04 DIAGNOSIS — N3001 Acute cystitis with hematuria: Secondary | ICD-10-CM

## 2018-11-04 DIAGNOSIS — O26892 Other specified pregnancy related conditions, second trimester: Secondary | ICD-10-CM | POA: Diagnosis not present

## 2018-11-04 DIAGNOSIS — O2302 Infections of kidney in pregnancy, second trimester: Secondary | ICD-10-CM | POA: Diagnosis not present

## 2018-11-04 DIAGNOSIS — N12 Tubulo-interstitial nephritis, not specified as acute or chronic: Secondary | ICD-10-CM

## 2018-11-04 DIAGNOSIS — R1030 Lower abdominal pain, unspecified: Secondary | ICD-10-CM

## 2018-11-04 DIAGNOSIS — Z3A19 19 weeks gestation of pregnancy: Secondary | ICD-10-CM | POA: Diagnosis not present

## 2018-11-04 DIAGNOSIS — Z6791 Unspecified blood type, Rh negative: Secondary | ICD-10-CM | POA: Diagnosis not present

## 2018-11-04 LAB — POCT URINALYSIS DIPSTICK
Glucose, UA: NEGATIVE
PROTEIN UA: POSITIVE — AB
SPEC GRAV UA: 1.025 (ref 1.010–1.025)
Urobilinogen, UA: 0.2 E.U./dL
pH, UA: 6 (ref 5.0–8.0)

## 2018-11-04 LAB — BASIC METABOLIC PANEL
Anion gap: 7 (ref 5–15)
BUN: 8 mg/dL (ref 6–20)
CO2: 24 mmol/L (ref 22–32)
CREATININE: 0.51 mg/dL (ref 0.44–1.00)
Calcium: 8.7 mg/dL — ABNORMAL LOW (ref 8.9–10.3)
Chloride: 104 mmol/L (ref 98–111)
GFR calc Af Amer: >60 mL/min (ref >60)
GFR calc non Af Amer: >60 mL/min (ref >60)
Glucose, Bld: 122 mg/dL — ABNORMAL HIGH (ref 70–99)
Potassium: 3.7 mmol/L (ref 3.5–5.1)
Sodium: 135 mmol/L (ref 135–145)

## 2018-11-04 LAB — URINALYSIS, COMPLETE (UACMP) WITH MICROSCOPIC
Bilirubin Urine: NEGATIVE
Glucose, UA: NEGATIVE mg/dL
KETONES UR: 5 mg/dL — AB
Nitrite: POSITIVE — AB
PROTEIN: NEGATIVE mg/dL
Specific Gravity, Urine: 1.008 (ref 1.005–1.030)
pH: 6 (ref 5.0–8.0)

## 2018-11-04 LAB — CBC
HCT: 38 % (ref 36.0–46.0)
Hemoglobin: 12.5 g/dL (ref 12.0–15.0)
MCH: 28.2 pg (ref 26.0–34.0)
MCHC: 32.9 g/dL (ref 30.0–36.0)
MCV: 85.8 fL (ref 80.0–100.0)
Platelets: 223 10*3/uL (ref 150–400)
RBC: 4.43 MIL/uL (ref 3.87–5.11)
RDW: 13.5 % (ref 11.5–15.5)
WBC: 12.9 10*3/uL — ABNORMAL HIGH (ref 4.0–10.5)
nRBC: 0 % (ref 0.0–0.2)

## 2018-11-04 LAB — PROTIME-INR
INR: 1 (ref 0.8–1.2)
Prothrombin Time: 12.6 seconds (ref 11.4–15.2)

## 2018-11-04 MED ORDER — MORPHINE SULFATE (PF) 2 MG/ML IV SOLN
1.0000 mg | INTRAVENOUS | Status: DC | PRN
  Administered 2018-11-04: 1 mg via INTRAVENOUS
  Filled 2018-11-04: qty 1

## 2018-11-04 MED ORDER — LACTATED RINGERS IV SOLN
INTRAVENOUS | Status: DC
  Administered 2018-11-04 – 2018-11-05 (×2): via INTRAVENOUS

## 2018-11-04 MED ORDER — ONDANSETRON HCL 4 MG PO TABS: 4.0000 mg | ORAL_TABLET | Freq: Four times a day (QID) | ORAL | Status: DC | PRN

## 2018-11-04 MED ORDER — DOCUSATE SODIUM 100 MG PO CAPS
100.0000 mg | ORAL_CAPSULE | Freq: Two times a day (BID) | ORAL | Status: DC
  Administered 2018-11-04 – 2018-11-05 (×2): 100 mg via ORAL
  Filled 2018-11-04 (×2): qty 1

## 2018-11-04 MED ORDER — ACETAMINOPHEN 500 MG PO TABS: 1000.0000 mg | ORAL_TABLET | Freq: Four times a day (QID) | ORAL | Status: DC | PRN

## 2018-11-04 MED ORDER — SODIUM CHLORIDE 0.9 % IV SOLN
1.0000 g | Freq: Two times a day (BID) | INTRAVENOUS | Status: DC
  Administered 2018-11-04: 1 g via INTRAVENOUS
  Filled 2018-11-04: qty 1
  Filled 2018-11-04: qty 10

## 2018-11-04 MED ORDER — ONDANSETRON HCL 4 MG/2ML IJ SOLN
4.0000 mg | Freq: Four times a day (QID) | INTRAMUSCULAR | Status: DC | PRN
  Administered 2018-11-05 (×2): 4 mg via INTRAVENOUS
  Filled 2018-11-04 (×2): qty 2

## 2018-11-04 MED ORDER — HYDROCODONE-ACETAMINOPHEN 5-325 MG PO TABS
1.0000 | ORAL_TABLET | ORAL | Status: DC | PRN
  Administered 2018-11-04 – 2018-11-05 (×2): 1 via ORAL
  Filled 2018-11-04 (×2): qty 1

## 2018-11-04 NOTE — Progress Notes (Signed)
Patient ID: Cheyenne Gomez, female   DOB: Apr 05, 1991, 28 y.o.   MRN: [id number]  Reason for Consult: Routine Prenatal Visit   Referred by No ref. provider found  Subjective:     HPI:  Cheyenne Gomez is a 28 y.o. female she presents today with complaints of right sided pain. She reports that the pain started 1-2 days ago. She denies vomiting or nausea. She denies fevers. She denies pain with urination. She reports recent increase in frequency. She is feeling fetal movement. She denies fundal tenderness. She denies vaginal bleeding. She denies abnormal vaginal discharge. She denies changes in bowel movements.   Pregnancy #4 Problems (from 08/15/18 to present)    Problem Noted Resolved   Obesity in pregnancy, antepartum 09/12/2018 by Natale Milch, MD No   Supervision of other normal pregnancy, antepartum 08/27/2018 by Vena [place], MD No   Overview Addendum 09/12/2018  5:34 PM by Natale Milch, MD    [facility]inic [facility]atal [facility]  Dating 7 week [place] Blood type: A negative  Genetic Screen Declines Antibody: negative  Anatomic [place]  Rubella: Immune Varicella: Immune  GTT   Third trimester:  RPR: NR  Rhogam  [ ]  28 weeks HBsAg: negative  TDaP vaccine                        Flu Shot: 09/12/2018 HIV: negative  Baby Food                                GBS:   Contraception  Pap: 08/05/2018 NIL HPV positive  CBB     CS/VBAC    Support Person            Rh negative state in antepartum period 08/27/2018 by Vena [place], MD No       Past Medical History:  Diagnosis Date  . Calculus of gallbladder with biliary obstruction but without cholecystitis    No family history on file. No past surgical history on file.  Short Social History:  Social History   Tobacco Use  . Smoking status: Never Smoker  . Smokeless tobacco: Never Used  Substance Use Topics  . Alcohol use: No    No Known Allergies  Current Outpatient Medications  Medication Sig  Dispense Refill  . Prenatal Vit-Fe Fumarate-FA (MULTIVITAMIN-PRENATAL) 27-0.8 MG TABS tablet Take 1 tablet by mouth daily at 12 noon.    . ondansetron (ZOFRAN ODT) 4 MG disintegrating tablet Take 1 tablet (4 mg total) by mouth every 8 (eight) hours as needed for nausea or vomiting. (Patient not taking: Reported on 11/04/2018) 20 tablet 0   No current facility-administered medications for this visit.     Review of Systems  Constitutional: Negative for chills, fatigue, fever and unexpected weight change.  HENT: Negative for trouble swallowing.  Eyes: Negative for loss of vision.  Respiratory: Negative for cough, shortness of breath and wheezing.  Cardiovascular: Negative for chest pain, leg swelling, palpitations and syncope.  GI: Negative for abdominal pain, blood in stool, diarrhea, nausea and vomiting.  GU: Positive for frequency. Negative for difficulty urinating, dysuria and hematuria.  Musculoskeletal: Positive for back pain. Negative for leg pain and joint pain.  Skin: Negative for rash.  Neurological: Negative for dizziness, headaches, light-headedness, numbness and seizures.  Psychiatric: Negative for behavioral problem, confusion, depressed mood and sleep disturbance.  Right sided CVA tenderness on examination  Objective:  Objective   Vitals:   11/04/18 1428  BP: 120/64  Weight: 205 lb (93 kg)   Body mass index is 36.31 kg/m.  Physical Exam Vitals signs and nursing note reviewed.  Constitutional:      Appearance: She is well-developed.  HENT:     Head: Normocephalic and atraumatic.  Eyes:     Pupils: Pupils are equal, round, and reactive to light.  Cardiovascular:     Rate and Rhythm: Normal rate and regular rhythm.  Pulmonary:     Effort: Pulmonary effort is normal. No respiratory distress.  Skin:    General: Skin is warm and dry.  Neurological:     Mental Status: She is alert and oriented to person, place, and time.  Psychiatric:        Behavior: Behavior  normal.        Thought Content: Thought content normal.        Judgment: Judgment normal.     Data: UA in office positive for nitrates, leukocytes, and blood.      Assessment/Plan:     29 yo with pyelonephritis.  Patient direct admitted to labor and delivery for IV antibiotics and pain management     Adelene Idler MD [facility], [facility] 11/05/2018 6:35 PM

## 2018-11-04 NOTE — H&P (Signed)
OB History & Physical   History of Present Illness:  Chief Complaint: Right flank pain  HPI:  Cheyenne Gomez is a 28 y.o. [phone] female at [redacted]w[redacted]d dated by ultrasound.  Her pregnancy has been complicated by Rh negative state, calculus of gallbladder with biliary obstruction but without cholecystitis, obesity.    She denies contractions.   She denies leakage of fluid.   She denies vaginal bleeding.   She reports fetal movement.    She was seen in the office for routine prenatal visit today. She was sent as a direct admit to Antepartum and presented with right flank pain with a score of 9/10. She said the pain started yesterday. She denies any problems with urination. She denies fever, chills, nausea, vomiting. She denies epigastric pain, headache or blurry vision.   Patient states morphine did not help relieve pain. Will change to PO medication.   Interview done with the help of electronic interpreter  Maternal Medical History:   Past Medical History:  Diagnosis Date  . Calculus of gallbladder with biliary obstruction but without cholecystitis     No past surgical history on file.  No Known Allergies  Prior to Admission medications   Medication Sig Start Date End Date Taking? Authorizing Provider  ondansetron (ZOFRAN ODT) 4 MG disintegrating tablet Take 1 tablet (4 mg total) by mouth every 8 (eight) hours as needed for nausea or vomiting. Patient not taking: Reported on 11/04/2018 06/07/17   Merrily Brittle, MD  Prenatal Vit-Fe Fumarate-FA (MULTIVITAMIN-PRENATAL) 27-0.8 MG TABS tablet Take 1 tablet by mouth daily at 12 noon.    [provider]    OB History  Gravida Para Term Preterm AB Living  5 4 3 1   3   SAB TAB Ectopic Multiple Live Births          1    # Outcome Date GA Lbr Len/2nd Weight Sex Delivery Anes PTL Lv  5 Current           4 Term 09/12/14    M Vag-Spont     3 Term 01/02/10    M Vag-Spont     2 Preterm 2009    F    ND  1 Term 04/09/06    Genella Mech       Prenatal care site: [facility]  Social History: She  reports that she has never smoked. She has never used smokeless tobacco. She reports that she does not drink alcohol or use drugs.  Family History: family history is not on file.  The [place]  Review of Systems: Negative x 10 systems reviewed except as noted in the HPI.    Physical Exam:  Vital Signs: BP 116/67 (BP Location: Right Arm)   Pulse 87   Temp 99.1 F (37.3 C) (Oral)   Resp 18   LMP 06/15/2018   SpO2 99%  Constitutional: Well nourished, well developed female in mild acute distress.  HEENT: normal Skin: Warm and dry.  Cardiovascular: Regular rate and rhythm.   Extremity: no edema Respiratory: Clear to auscultation bilateral. Normal respiratory effort Abdomen: FHT present, soft to palpation, lower right abdomen pain Back: right side CVA tenderness, right flank tenderness Neuro: DTRs 2+, Cranial nerves grossly intact Psych: Alert and Oriented x3. No memory deficits. Normal mood and affect.   Pelvic exam: deferred Fetal Heart Tones 160   Results for TAIRRA, BULLIS (MRN [id number]) as of 11/04/2018 18:40  Ref. Range 11/04/2018 14:45 11/04/2018 15:44 11/04/2018  15:56  BASIC METABOLIC PANEL Unknown   Rpt (A)  Sodium Latest Ref Range: 135 - 145 mmol/L   135  Potassium Latest Ref Range: 3.5 - 5.1 mmol/L   3.7  Chloride Latest Ref Range: 98 - 111 mmol/L   104  CO2 Latest Ref Range: 22 - 32 mmol/L   24  Glucose Latest Ref Range: 70 - 99 mg/dL   161 (H)  BUN Latest Ref Range: 6 - 20 mg/dL   8  Creatinine Latest Ref Range: 0.44 - 1.00 mg/dL   0.96  Calcium Latest Ref Range: 8.9 - 10.3 mg/dL   8.7 (L)  Anion gap Latest Ref Range: 5 - 15    7  GFR, Est Non African American Latest Ref Range: >60 mL/min   >60  GFR, Est African American Latest Ref Range: >60 mL/min   >60  WBC Latest Ref Range: 4.0 - 10.5 K/uL   12.9 (H)  RBC Latest Ref Range: 3.87 - 5.11 MIL/uL   4.43  Hemoglobin  Latest Ref Range: 12.0 - 15.0 g/dL   04.5  HCT Latest Ref Range: 36.0 - 46.0 %   38.0  MCV Latest Ref Range: 80.0 - 100.0 fL   85.8  MCH Latest Ref Range: 26.0 - 34.0 pg   28.2  MCHC Latest Ref Range: 30.0 - 36.0 g/dL   40.9  RDW Latest Ref Range: 11.5 - 15.5 %   13.5  Platelets Latest Ref Range: 150 - 400 K/uL   223  nRBC Latest Ref Range: 0.0 - 0.2 %   0.0  Prothrombin Time Latest Ref Range: 11.4 - 15.2 seconds   12.6  INR Latest Ref Range: 0.8 - 1.2    1.0  Appearance Latest Ref Range: CLEAR  Cloudy CLOUDY (A)   Bilirubin Urine Latest Ref Range: NEGATIVE   NEGATIVE   Bilirubin, UA Unknown +    Clarity, UA Unknown Cloudy    Color, UA Unknown Dark yellow    Color, Urine Latest Ref Range: YELLOW   YELLOW (A)   Glucose Latest Ref Range: Negative  Negative    Glucose, UA Latest Ref Range: NEGATIVE mg/dL  NEGATIVE   Hgb urine dipstick Latest Ref Range: NEGATIVE   SMALL (A)   Ketones, UA Unknown ++++    Ketones, ur Latest Ref Range: NEGATIVE mg/dL  5 (A)   Leukocytes, UA Latest Ref Range: Negative  Large (3+) (A)    Nitrite Latest Ref Range: NEGATIVE   POSITIVE (A)   Nitrite, UA Unknown 4+    pH Latest Ref Range: 5.0 - 8.0   6.0   pH, UA Latest Ref Range: 5.0 - 8.0  6.0    Protein Latest Ref Range: NEGATIVE mg/dL  NEGATIVE   Protein, UA Latest Ref Range: Negative  Positive (A)    Specific Gravity, UA Latest Ref Range: 1.010 - 1.025  1.025    Specific Gravity, Urine Latest Ref Range: 1.005 - 1.030   1.008   Urobilinogen, UA Latest Ref Range: 0.2 or 1.0 E.U./dL 0.2    Bacteria, UA Latest Ref Range: NONE SEEN   RARE (A)   Mucus Unknown  PRESENT   RBC / HPF Latest Ref Range: 0 - 5 RBC/hpf  0-5   RBC, UA Unknown +++    Squamous Epithelial / LPF Latest Ref Range: 0 - 5   11-20   WBC Clumps Unknown  PRESENT   WBC, UA Latest Ref Range: 0 - 5 WBC/hpf  >50 (H)  Leukocytes,Ua Latest Ref Range: NEGATIVE   LARGE (A)   Odor Unknown Slight       Pertinent Results:  Prenatal Labs: Blood  type/Rh A negative  Antibody screen negative  Rubella Immune  Varicella Immune    RPR Non-reactive  HBsAg negative  HIV negative  GC negative  Chlamydia negative  Genetic screening Not done  1 hour GTT Early Hgb A1C normal 5.5  3 hour GTT Not done  GBS Not done     Assessment:  Panayiota Brandner is a 28 y.o. [id number] female at [redacted]w[redacted]d with presumed Pyelonephritis.   Plan:  1. Admit to antepartum/observation 2. CBC, BMP, Protime/INR, Urinalysis, Urine Culture 3. IV fluids, IV antibiotics- Rocephin 1 gram every 12 hours 4. Norco for pain relief 5. Regular diet 6. Doppler fetal heart tones Q day  Tresea Mall, [facility] 11/04/2018 6:27 PM

## 2018-11-04 NOTE — Progress Notes (Signed)
OB problem visit  8/10 pain on her RUQ and down into her entire lower abdomin

## 2018-11-05 DIAGNOSIS — Z3A19 19 weeks gestation of pregnancy: Secondary | ICD-10-CM | POA: Diagnosis not present

## 2018-11-05 DIAGNOSIS — Z6791 Unspecified blood type, Rh negative: Secondary | ICD-10-CM | POA: Diagnosis not present

## 2018-11-05 DIAGNOSIS — O26892 Other specified pregnancy related conditions, second trimester: Secondary | ICD-10-CM | POA: Diagnosis not present

## 2018-11-05 DIAGNOSIS — N3001 Acute cystitis with hematuria: Secondary | ICD-10-CM | POA: Diagnosis not present

## 2018-11-05 DIAGNOSIS — O2302 Infections of kidney in pregnancy, second trimester: Secondary | ICD-10-CM | POA: Diagnosis not present

## 2018-11-05 LAB — CBC
HCT: 34.3 % — ABNORMAL LOW (ref 36.0–46.0)
HEMOGLOBIN: 11.3 g/dL — AB (ref 12.0–15.0)
MCH: 28.4 pg (ref 26.0–34.0)
MCHC: 32.9 g/dL (ref 30.0–36.0)
MCV: 86.2 fL (ref 80.0–100.0)
Platelets: 205 10*3/uL (ref 150–400)
RBC: 3.98 MIL/uL (ref 3.87–5.11)
RDW: 13.7 % (ref 11.5–15.5)
WBC: 10.9 10*3/uL — ABNORMAL HIGH (ref 4.0–10.5)
nRBC: 0 % (ref 0.0–0.2)

## 2018-11-05 MED ORDER — AMOXICILLIN-POT CLAVULANATE 875-125 MG PO TABS: 1.0000 | ORAL_TABLET | Freq: Two times a day (BID) | ORAL | 0 refills | Status: AC

## 2018-11-05 MED ORDER — SODIUM CHLORIDE 0.9 % IV SOLN
1.0000 g | Freq: Two times a day (BID) | INTRAVENOUS | Status: DC
  Administered 2018-11-05 (×2): 1 g via INTRAVENOUS
  Filled 2018-11-05 (×2): qty 10
  Filled 2018-11-05: qty 1

## 2018-11-05 NOTE — Progress Notes (Signed)
Vitals stable this morning. Blood pressure slightly decreased at 93/54. No complaints of pain or N/V currently. Patient had nausea around 5am, per night shift RN. Patient states that was when she received her antibiotic and she thinks nausea was from the headache that the antibiotic gave her. Voiding adequately. Tolerating PO food/fluids. Ambulating in room, tolerating well. FHT 144. No vaginal bleeding or LOF. [employer] interpreter used for morning assessment. RN to continue to monitor.

## 2018-11-05 NOTE — Final Progress Note (Signed)
   CHIEF COMPLAINT:  No chief complaint on file.   Subjective  Kynsli is improving. She has not required any pain medication today. She reports her right sided back pain is much improved. She denies vaginal bleeding, leakage of fluid or abnormal discharge. She reports good fetal movement. She denies contractions. She has not had fevers. She is tolerating a regular diet.    Objective   Examination:  General exam: Appears calm and comfortable  Respiratory system: Clear to auscultation. Respiratory effort normal. HEENT: [place]/AT, PERRLA, no thrush, no stridor. Cardiovascular system: S1 & S2 heard, RRR. No JVD, murmurs, rubs, gallops or clicks. No pedal edema. Gastrointestinal system: Abdomen is nondistended, soft and nontender. No organomegaly or masses felt. Normal bowel sounds heard. Central nervous system: Alert and oriented. No focal neurological deficits. Extremities: No swelling or edema. Skin: No rashes, lesions or ulcers Psychiatry: Judgement and insight appear normal. Mood & affect appropriate.  No CVA tenderness  VITALS:  height is 5\' 3"  (1.6 m) and weight is 93 kg. Her oral temperature is 98 F (36.7 C). Her blood pressure is 93/54 (abnormal) and her pulse is 84. Her respiration is 20 and oxygen saturation is 98%.   I personally reviewed Labs under Results section.  Radiology Reports No results found.    Assessment/Plan:  28 yo with pyelonephritis  1. Will discharge home this evening with prescription medication to take for the next two weeks. Will call patient to notify her if antibiotics need to be changed based on urine culture results.   2. Patient given note to be off of work this weekend. 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  3. Follow up in office in 1 week.   Code Status  LOS: 0 days   Danial Sisley R Demarkus Remmel

## 2018-11-05 NOTE — Progress Notes (Signed)
Day of Admission: 11/04/2018 Today's date: 11/05/2018  Diagnosis: Right pyelonephritis  S: Via interpretor: No back pain this AM. Norco relieved pain yesterday. Had some nausea earlier this AM when she had a headache, but tolerating a regular diet this AM. +fetal movement. Has received two doses of Rocephin.  O: BP (!) 93/54 (BP Location: Right Arm)   Pulse 84   Temp 98 F (36.7 C) (Oral)   Resp 20   Ht 5\' 3"  (1.6 m)   Wt 93 kg   LMP 06/15/2018   SpO2 98%   BMI 36.31 kg/m   Max temp: 99.1 Urine out put 1450 overnight +433ml this AM. General: smiling, in NAD FHTs WNL Back: negative for CVAT Extremities: TED hose on. No calf tenderness  Results for orders placed or performed during the hospital encounter of 11/04/18 (from the past 24 hour(s))  Urinalysis, Complete w Microscopic     Status: Abnormal   Collection Time: 11/04/18  3:44 PM  Result Value Ref Range   Color, Urine YELLOW (A) YELLOW   APPearance CLOUDY (A) CLEAR   Specific Gravity, Urine 1.008 1.005 - 1.030   pH 6.0 5.0 - 8.0   Glucose, UA NEGATIVE NEGATIVE mg/dL   Hgb urine dipstick SMALL (A) NEGATIVE   Bilirubin Urine NEGATIVE NEGATIVE   Ketones, ur 5 (A) NEGATIVE mg/dL   Protein, ur NEGATIVE NEGATIVE mg/dL   Nitrite POSITIVE (A) NEGATIVE   Leukocytes,Ua LARGE (A) NEGATIVE   RBC / HPF 0-5 0 - 5 RBC/hpf   WBC, UA >50 (H) 0 - 5 WBC/hpf   Bacteria, UA RARE (A) NONE SEEN   Squamous Epithelial / LPF 11-20 0 - 5   WBC Clumps PRESENT    Mucus PRESENT   Basic metabolic panel     Status: Abnormal   Collection Time: 11/04/18  3:56 PM  Result Value Ref Range   Sodium 135 135 - 145 mmol/L   Potassium 3.7 3.5 - 5.1 mmol/L   Chloride 104 98 - 111 mmol/L   CO2 24 22 - 32 mmol/L   Glucose, Bld 122 (H) 70 - 99 mg/dL   BUN 8 6 - 20 mg/dL   Creatinine, Ser 2.91 0.44 - 1.00 mg/dL   Calcium 8.7 (L) 8.9 - 10.3 mg/dL   GFR calc non Af Amer >60 >60 mL/min   GFR calc Af Amer >60 >60 mL/min   Anion gap 7 5 - 15  CBC     Status:  Abnormal   Collection Time: 11/04/18  3:56 PM  Result Value Ref Range   WBC 12.9 (H) 4.0 - 10.5 K/uL   RBC 4.43 3.87 - 5.11 MIL/uL   Hemoglobin 12.5 12.0 - 15.0 g/dL   HCT 91.6 60.6 - 00.4 %   MCV 85.8 80.0 - 100.0 fL   MCH 28.2 26.0 - 34.0 pg   MCHC 32.9 30.0 - 36.0 g/dL   RDW 59.9 77.4 - 14.2 %   Platelets 223 150 - 400 K/uL   nRBC 0.0 0.0 - 0.2 %  Protime-INR     Status: None   Collection Time: 11/04/18  3:56 PM  Result Value Ref Range   Prothrombin Time 12.6 11.4 - 15.2 seconds   INR 1.0 0.8 - 1.2  CBC     Status: Abnormal   Collection Time: 11/05/18  4:36 AM  Result Value Ref Range   WBC 10.9 (H) 4.0 - 10.5 K/uL   RBC 3.98 3.87 - 5.11 MIL/uL   Hemoglobin 11.3 (L) 12.0 -  15.0 g/dL   HCT 45.0 (L) 38.8 - 82.8 %   MCV 86.2 80.0 - 100.0 fL   MCH 28.4 26.0 - 34.0 pg   MCHC 32.9 30.0 - 36.0 g/dL   RDW 00.3 49.1 - 79.1 %   Platelets 205 150 - 400 K/uL   nRBC 0.0 0.0 - 0.2 %    A: PAD #1Right pyelonephritis-improving status with WBC decreasing from 12.9K to 10.9K, afebrile and no flank pain this AM.  P: Continue current POM Urine culture still pending Possible discharge this evening per Dr Jerene Pitch  Farrel Conners, CNM

## 2018-11-05 NOTE — Discharge Instructions (Signed)
Pielonefritis durante el embarazo Pyelonephritis During Pregnancy  Cules son las causas? La causa de esta afeccin es una infeccin bacteriana en las vas urinarias inferiores que se disemina al rin. Qu incrementa el riesgo? Es ms probable que contraiga esta afeccin si:  Tiene diabetes.  Tiene antecedentes de infecciones de las vas urinarias frecuentes. Cules son los signos o sntomas? Los sntomas de esta afeccin pueden comenzar con sntomas de una infeccin de las vas urinarias inferiores. Estos signos pueden incluir lo siguiente:  [place] necesidad urgente y frecuente de Geographical information systems officer.  Dolor urente al [employer].  Dolor y presin en la parte inferior del abdomen.  Presenta sangre en la orina.  Mason Jim turbia o con mal olor. A medida que la infeccin se extiende al rin, pueden presentarse los siguientes sntomas:  Fiebre.  Escalofros.  Dolor y sensibilidad en la parte superior del abdomen o en la espalda y los costados (dolor en la fosa lumbar). El dolor en la fosa lumbar suele afectar un lado solo del cuerpo, generalmente el lado derecho.  Prdida del apetito, nuseas o vmitos. Cmo se diagnostica? Esta afeccin se puede diagnosticar en funcin de lo siguiente:  Los sntomas y los antecedentes mdicos.  Un examen fsico.  Pruebas para confirmar el diagnstico. Estos signos pueden incluir lo siguiente: ? Anlisis de sangre para controlar la funcin renal y Engineer, manufacturing signos de infeccin. ? Anlisis de orina para detectar signos de infeccin, lo que incluye bacterias, glbulos blancos o glbulos rojos y protenas. ? Estudios para cultivar e identificar el tipo de bacterias que estn causando la infeccin (cultivo de [place]). ? Estudios de diagnstico por imgenes de los riones para obtener ms informacin acerca de la enfermedad. Cmo se trata? Esta afeccin se trata en el hospital con antibiticos que se administran en las venas a travs de una va Guadalupe Guerra. El  mdico puede hacer lo siguiente:  Corporate investment banker a administrarle un antibitico que sea Facilities manager las infecciones comunes de las vas [place].  Puede cambiar a otro antibitico si los [employer] del cultivo de [place] demuestran que la infeccin es originada por bacterias diferentes.  Tendr cuidado de elegir antibiticos que sean los ms seguros Academic librarian. Otros tratamientos pueden incluir los siguientes:  Administracin de lquidos por va intravenosa si tiene nuseas y no puede ingerir lquidos.  Analgsicos y antipirticos.  Medicamentos para las nuseas y los vmitos. Podr volver a su casa cuando la infeccin est controlada. Para prevenir otra infeccin, es posible que deba continuar tomando antibiticos por va oral hasta que el beb nazca. Siga estas indicaciones en su casa: Medicamentos   [employer] de venta libre y los recetados solamente como se lo haya indicado el mdico.  Tome su antibitico como se lo haya indicado el mdico. No deje de tomar el antibitico, aunque comience a sentirse mejor.  Siga tomando las vitaminas prenatales. Estilo de vida   Siga las instrucciones del mdico respecto de las restricciones de comidas o bebidas. Puede ser necesario que evite lo siguiente: ? Alimentos y bebidas con azcar agregada. ? Cafena y jugos de frutas.  Beber suficiente lquido como para Pharmacologist la orina de color amarillo plido.  Vaya al bao con frecuencia. No retenga la orina. Intente vaciar complemente la vejiga.  Cmbiese la ropa interior [employer]. Use ropa interior de algodn. No use ropa interior ni pantalones ajustados. Indicaciones generales   [employer] que se indican a continuacin para disminuir el riesgo de que entren bacterias en sus vas urinarias: ?  Use jabn lquido en lugar de jabn en pan cuando se duche o se bae. Las bacterias pueden multiplicarse sobre el jabn en pan. ? Cuando se lave, limpie primero el orificio  de Engineer, mining. Use un pao para limpiar la zona que se encuentra entre la vagina y el ano. Squela bien con una toalla limpia dando golpecitos. ? Lvese las manos antes y despus de ir al bao. ? Cuando vaya al bao, lmpiese de adelante hacia atrs. ? No use duchas vaginales, jabn perfumado, cremas ni talcos. ? No tome un bao de inmersin durante ms de 30 minutos.  Reanude sus actividades normales segn lo indicado por el mdico. Pregntele al mdico qu actividades son seguras para usted.  Concurra a todas las visitas de [address] se lo haya indicado el mdico. Esto es importante. Comunquese con un mdico si:  Siente escalofros o tiene fiebre.  Tiene sntomas de infeccin que no mejoran en el hogar.  Los sntomas de infeccin aparecen nuevamente.  Tiene una reaccin o efectos secundarios de los antibiticos. Solicita ayuda inmediatamente si:  Comienza a Technical brewer. Resumen  La pielonefritis es una infeccin de un rin o de los riones.  Esta afeccin se produce cuando una infeccin bacteriana en las vas urinarias inferiores se disemina al rin.  Las infecciones de las vas urinarias inferiores son frecuentes Academic librarian.  La pielonefritis causa escalofros, fiebre, dolor en la fosa lumbar y nuseas.  La pielonefritis es una infeccin grave que, por lo general, se trata en el hospital con antibiticos intravenosos. Esta informacin no tiene Theme park manager el consejo del mdico. Asegrese de hacerle al mdico cualquier pregunta que tenga. Document Released: 12/28/2017 Document Revised: 12/28/2017 Document Reviewed: 12/28/2017 Elsevier Interactive Patient Education  2019 [employer].

## 2018-11-05 NOTE — Progress Notes (Signed)
Discharge instructions complete and prescriptions sent to pharmacy. Video interpreter used for discharge teaching. Patient verbalizes understanding of teaching. Patient discharged home via wheelchair at 1800.

## 2018-11-07 LAB — URINE CULTURE

## 2018-11-08 ENCOUNTER — Other Ambulatory Visit: Payer: Self-pay | Admitting: Obstetrics and Gynecology

## 2018-11-08 DIAGNOSIS — N10 Acute tubulo-interstitial nephritis: Secondary | ICD-10-CM

## 2018-11-08 MED ORDER — CEPHALEXIN 500 MG PO CAPS: 500.0000 mg | ORAL_CAPSULE | Freq: Two times a day (BID) | ORAL | 0 refills | Status: AC

## 2018-11-08 NOTE — Progress Notes (Signed)
Pt aware.

## 2018-11-08 NOTE — Progress Notes (Signed)
Please help me call this patient to change her antibiotic. Thank you

## 2018-11-11 ENCOUNTER — Encounter: Payer: Self-pay | Admitting: Obstetrics and Gynecology

## 2018-11-11 ENCOUNTER — Ambulatory Visit (INDEPENDENT_AMBULATORY_CARE_PROVIDER_SITE_OTHER): Payer: BLUE CROSS/BLUE SHIELD | Admitting: Obstetrics and Gynecology

## 2018-11-11 ENCOUNTER — Ambulatory Visit (INDEPENDENT_AMBULATORY_CARE_PROVIDER_SITE_OTHER): Payer: BLUE CROSS/BLUE SHIELD

## 2018-11-11 VITALS — BP 100/64 | Wt 208.0 lb

## 2018-11-11 DIAGNOSIS — Z363 Encounter for antenatal screening for malformations: Secondary | ICD-10-CM

## 2018-11-11 DIAGNOSIS — O26892 Other specified pregnancy related conditions, second trimester: Secondary | ICD-10-CM

## 2018-11-11 DIAGNOSIS — O09292 Supervision of pregnancy with other poor reproductive or obstetric history, second trimester: Secondary | ICD-10-CM

## 2018-11-11 DIAGNOSIS — Z3A20 20 weeks gestation of pregnancy: Secondary | ICD-10-CM

## 2018-11-11 DIAGNOSIS — Z87440 Personal history of urinary (tract) infections: Secondary | ICD-10-CM

## 2018-11-11 DIAGNOSIS — Z8759 Personal history of other complications of pregnancy, childbirth and the puerperium: Principal | ICD-10-CM

## 2018-11-11 DIAGNOSIS — Z3480 Encounter for supervision of other normal pregnancy, unspecified trimester: Secondary | ICD-10-CM

## 2018-11-11 MED ORDER — NITROFURANTOIN MONOHYD MACRO 100 MG PO CAPS: 100.0000 mg | ORAL_CAPSULE | Freq: Every day | ORAL | 5 refills | Status: DC

## 2018-11-11 NOTE — Progress Notes (Signed)
ROB/Anatomy Pain is gone

## 2018-11-11 NOTE — Progress Notes (Signed)
Routine Prenatal Care Visit  Subjective  Cheyenne Gomez is a 28 y.o. [phone] at [redacted]w[redacted]d being seen today for ongoing prenatal care.  She is currently monitored for the following issues for this low-risk pregnancy and has Calculus of gallbladder with biliary obstruction but without cholecystitis; Supervision of other normal pregnancy, antepartum; Rh negative state in antepartum period; Obesity in pregnancy, antepartum; and Pyelonephritis affecting pregnancy in second trimester on their problem list.  ----------------------------------------------------------------------------------- Patient reports no complaints.   Contractions: Not present. Vag. Bleeding: None.  Movement: Present. Denies leaking of fluid.  ----------------------------------------------------------------------------------- The following portions of the patient's history were reviewed and updated as appropriate: allergies, current medications, past family history, past medical history, past social history, past surgical history and problem list. Problem list updated.   Objective  Blood pressure 100/64, weight 208 lb (94.3 kg), last menstrual period 06/15/2018. Pregravid weight 207 lb (93.9 kg) Total Weight Gain 1 lb (0.454 kg) Urinalysis:      Fetal Status: Fetal Heart Rate (bpm): 150   Movement: Present  Presentation: Vertex  General:  Alert, oriented and cooperative. Patient is in no acute distress.  Skin: Skin is warm and dry. No rash noted.   Cardiovascular: Normal heart rate noted  Respiratory: Normal respiratory effort, no problems with respiration noted  Abdomen: Soft, gravid, appropriate for gestational age. Pain/Pressure: Absent     Pelvic:  Cervical exam deferred        Extremities: Normal range of motion.     Mental Status: Normal mood and affect. Normal behavior. Normal judgment and thought content.     Assessment   28 y.o. [id number] at [redacted]w[redacted]d by  03/28/2019, by Ultrasound presenting for routine  prenatal visit  Plan   Pregnancy #4 Problems (from 08/15/18 to present)    Problem Noted Resolved   Obesity in pregnancy, antepartum 09/12/2018 by Natale Milch, MD No   Supervision of other normal pregnancy, antepartum 08/27/2018 by Vena Austria, MD No   Overview Addendum 09/12/2018  5:34 PM by Natale Milch, MD    Clinic [facility] Prenatal Labs  Dating 7 week [place] Blood type: A negative  Genetic Screen Declines Antibody: negative  Anatomic [place]  Rubella: Immune Varicella: Immune  GTT Early:               Third trimester:  RPR: NR  Rhogam  [ ]  28 weeks HBsAg: negative  TDaP vaccine                        Flu Shot: 09/12/2018 HIV: negative  Baby Food                                GBS:   Contraception  Pap: 08/05/2018 NIL HPV positive  CBB     CS/VBAC    Support Person            Rh negative state in antepartum period 08/27/2018 by Vena Austria, MD No       Gestational age appropriate obstetric precautions including but not limited to vaginal bleeding, contractions, leaking of fluid and fetal movement were reviewed in detail with the patient.    Anatomy US complete Discussed starting suppressive therapy after completion of treatment medication for 10 days.  Work given to return to work.   Return in about 4 weeks (around 12/09/2018) for ROB.  Natale Milch MD [facility], [facility] 11/11/2018, 2:56 PM

## 2018-12-09 ENCOUNTER — Other Ambulatory Visit: Payer: Self-pay

## 2018-12-09 ENCOUNTER — Ambulatory Visit (INDEPENDENT_AMBULATORY_CARE_PROVIDER_SITE_OTHER): Payer: BLUE CROSS/BLUE SHIELD | Admitting: Obstetrics and Gynecology

## 2018-12-09 ENCOUNTER — Encounter: Payer: Self-pay | Admitting: Obstetrics and Gynecology

## 2018-12-09 VITALS — BP 112/56 | Wt 214.0 lb

## 2018-12-09 DIAGNOSIS — Z3480 Encounter for supervision of other normal pregnancy, unspecified trimester: Secondary | ICD-10-CM

## 2018-12-09 DIAGNOSIS — Z87448 Personal history of other diseases of urinary system: Secondary | ICD-10-CM

## 2018-12-09 DIAGNOSIS — O26842 Uterine size-date discrepancy, second trimester: Secondary | ICD-10-CM

## 2018-12-09 DIAGNOSIS — Z3A24 24 weeks gestation of pregnancy: Secondary | ICD-10-CM

## 2018-12-09 DIAGNOSIS — O26849 Uterine size-date discrepancy, unspecified trimester: Secondary | ICD-10-CM

## 2018-12-09 LAB — POCT URINALYSIS DIPSTICK OB
Glucose, UA: NEGATIVE
POC,PROTEIN,UA: NEGATIVE

## 2018-12-09 NOTE — Patient Instructions (Signed)
 Hello,  Given the current COVID-19 pandemic, our practice is making changes in how we are providing care to our patients. We are limiting in-person visits for the safety of all of our patients.   As a practice, we have met to discuss the best way to minimize visits, but still provide excellent care to our expecting mothers.  We have decided on the following visit structure for low-risk pregnancies.  Initial Pregnancy visit will be conducted as a telephone or web visit.  Between 10-14 weeks  there will be one in-person visit for an ultrasound, lab work, and genetic screening. 20 weeks in-person visit with an anatomy ultrasound  28 weeks in-person office visit for a 1-hour glucose test and a TDAP vaccination 32 weeks in-person office visit 34 weeks telephone visit 36 weeks in-person office visit for GBS, chlamydia, and gonorrhea testing 38 weeks in-person office visit 40 weeks in-person office visit  Understandably, some patients will require more visits than what is outlined above. Additional visits will be determined on a case-by-case basis.   We will, as always, be available for emergencies or to address concerns that might arise between in-person visits. We ask that you allow us the opportunity to address any concerns over the phone or through a virtual visit first. We will be available to return your phone calls throughout the day.   If you are able to purchase a scale, a blood pressure machine, and a home fetal doppler visits could be limited further. This will help decrease your exposure risks, but these purchases are not a necessity.   Things seem to change daily and there is the possibility that this structure could change, please be patient as we adapt to a new way of caring for patients.   Thank you for trusting us with your prenatal care. Our practice values you and looks forward to providing you with excellent care.   Sincerely,   [facility], [facility]      COVID-19 and Your Pregnancy FAQ  How can I prevent infection with COVID-19 during my pregnancy? Social distancing is key. Please limit any interactions in public. Try and work from home if possible. Frequently wash your hands after touching possibly contaminated surfaces. Avoid touching your face.  Minimize trips to the store. Consider online ordering when possible.   Should I wear a mask? YES. It is recommended by the CDC that all people wear a cloth mask or facial covering in public. This will help reduce transmission as well as your risk or acquiring COVID-19. New studies are showing that even asymptomatic individuals can spread the virus from talking.   What are the symptoms of COVID-19? Fever (greater than 100.4 F), dry cough, shortness of breath.  Am I more at risk for COVID-19 since I am pregnant? There is not currently data showing that pregnant women are more adversely impacted by COVID-19 than the general population. However, we know that pregnant women tend to have worse respiratory complications from similar diseases such as the flu and SARS and for this reason should be considered an at-risk population.  What do I do if I am experiencing the symptoms of COVID-19? Testing is being limited because of test availability. If you are experiencing symptoms you should quarantine yourself, and the members of your family, for at least 2 weeks at home.   Please visit this website for more information: https://www.cdc.gov/coronavirus/2019-ncov/if-you-are-sick/steps-when-sick.html  When should I go to the Emergency Room? Please go to the emergency room if   you are experiencing ANY of these symptoms*:  1.    Difficulty breathing or shortness of breath 2.    Persistent pain or pressure in the chest 3.    Confusion or difficulty being aroused (or awakened) 4.    Bluish lips or face  *This list is not all inclusive. Please consult our office for any other symptoms that are severe  or concerning.  What do I do if I am having difficulty breathing? You should go to the Emergency Room for evaluation. At this time they have a tent set up for evaluating patients with COVID-19 symptoms.   How will my prenatal care be different because of the COVID-19 pandemic? It has been recommended to reduce the frequency of face-to-face visits and use resources such as telephone and virtual visits when possible. Using a scale, blood pressure machine and fetal doppler at home can further help reduce face-to-face visits. You will be provided with additional information on this topic.  We ask that you come to your visits alone to minimize potential exposures to  COVID-19.  How can I receive childbirth education? At this time in-person classes have been cancelled. You can register for online childbirth education, breastfeeding, and newborn care classes.  Please visit:  [email or web address] for more information  How will my hospital birth experience be different? The hospital is currently limiting visitors. This means that while you are in labor you can only have one person at the hospital with you. Additional family members will not be allowed to wait in the building or outside your room. Your one support person can be the father of the baby, a relative, a doula, or a friend. Once one support person is designated that person will wear a band. This band cannot be shared with multiple people.  How long will I stay in the hospital for after giving birth? It is also recommended that discharge home be expedited during the COVID-19 outbreak. This means staying for 1 day after a vaginal delivery and 2 days after a cesarean section.  What if I have COVID-19 and I am in labor? We ask that you wear a mask while on labor and delivery. We will try and accommodate you being placed in a room that is capable of filtering the air. Please call ahead if you are in labor and on your way to the hospital. The  phone number for labor and delivery at [facility] is [phone].  If I have COVID-19 when my baby is born how can I prevent my baby from contracting COVID-19? This is an issue that will have to be discussed on a case-by-case basis. Current recommendations suggest providing separate isolation rooms for both the mother and new infant as well as limiting visitors. However, there are practical challenges to this recommendation. The situation will assuredly change and decisions will be influenced by the desires of the mother and availability of space.  Some suggestions are the use of a curtain or physical barrier between mom and infant, hand hygiene, mom wearing a mask, or 6 feet of spacing between a mom and infant.   Can I breastfeed during the COVID-19 pandemic?   Yes, breastfeeding is encouraged.  Can I breastfeed if I have COVID-19? Yes. Covid-19 has not been found in breast milk. This means you cannot give COVID-19 to your child through breast milk. Breast feeding will also help pass antibodies to fight infection to your baby.   What precautions should I take when breastfeeding   if I have COVID-19? If a mother and newborn do room-in and the mother wishes to feed at the breast, she should put on a facemask and practice hand hygiene before each feeding.  What precautions should I take when pumping if I have COVID-19? Prior to expressing breast milk, mothers should practice hand hygiene. After each pumping session, all parts that come into contact with breast milk should be thoroughly washed and the entire pump should be appropriately disinfected per the manufacturer's instructions. This expressed breast milk should be fed to the newborn by a healthy caregiver.  What if I am pregnant and work in healthcare? Based on limited data regarding COVID-19 and pregnancy, ACOG currently does not propose creating additional restrictions on pregnant health care personnel because of  COVID-19 alone. Pregnant women do not appear to be at higher risk of severe disease related to COVID-19. Pregnant health care personnel should follow CDC risk assessment and infection control guidelines for health care personnel exposed to patients with suspected or confirmed COVID-19. Adherence to recommended infection prevention and control practices is an important part of protecting all health care personnel in health care settings.    Information on COVID-19 in pregnancy is very limited; however, facilities may want to consider limiting exposure of pregnant health care personnel to patients with confirmed or suspected COVID-19 infection, especially during higher-risk procedures (eg, aerosol-generating procedures), if feasible, based on staffing availability.    

## 2018-12-09 NOTE — Progress Notes (Signed)
ROB

## 2018-12-09 NOTE — Progress Notes (Signed)
Routine Prenatal Care Visit  Subjective  Cheyenne Gomez is a 28 y.o. [phone] at [redacted]w[redacted]d being seen today for ongoing prenatal care.  She is currently monitored for the following issues for this low-risk pregnancy and has Calculus of gallbladder with biliary obstruction but without cholecystitis; Supervision of other normal pregnancy, antepartum; Rh negative state in antepartum period; Obesity in pregnancy, antepartum; and Pyelonephritis affecting pregnancy in second trimester on their problem list.  ----------------------------------------------------------------------------------- Patient reports no complaints.   Contractions: Not present. Vag. Bleeding: None.  Movement: Present. Denies leaking of fluid.  ----------------------------------------------------------------------------------- The following portions of the patient's history were reviewed and updated as appropriate: allergies, current medications, past family history, past medical history, past social history, past surgical history and problem list. Problem list updated.   Objective  Blood pressure (!) 112/56, weight 214 lb (97.1 kg), last menstrual period 06/15/2018. Pregravid weight 207 lb (93.9 kg) Total Weight Gain 7 lb (3.175 kg) Urinalysis:      Fetal Status: Fetal Heart Rate (bpm): 160   Movement: Present     General:  Alert, oriented and cooperative. Patient is in no acute distress.  Skin: Skin is warm and dry. No rash noted.   Cardiovascular: Normal heart rate noted  Respiratory: Normal respiratory effort, no problems with respiration noted  Abdomen: Soft, gravid, appropriate for gestational age. Pain/Pressure: Absent     Pelvic:  Cervical exam deferred        Extremities: Normal range of motion.     Mental Status: Normal mood and affect. Normal behavior. Normal judgment and thought content.     Assessment   28 y.o. [id number] at [redacted]w[redacted]d by  03/28/2019, by Ultrasound presenting for routine prenatal visit   Plan   Pregnancy #4 Problems (from 08/15/18 to present)    Problem Noted Resolved   Obesity in pregnancy, antepartum 09/12/2018 by Natale Milch, MD No   Supervision of other normal pregnancy, antepartum 08/27/2018 by Vena Austria, MD No   Overview Addendum 12/09/2018  6:07 PM by Natale Milch, MD    Clinic [facility] Prenatal Labs  Dating 7 week [place] Blood type: A negative  Genetic Screen Declines Antibody: negative  Anatomic [place] complete Rubella: Immune Varicella: Immune  GTT Early: hgbA1C 5.5 Third trimester:  RPR: NR  Rhogam  [ ]  28 weeks HBsAg: negative  TDaP vaccine                        Flu Shot: 09/12/2018 HIV: negative  Baby Food  Breast- experienced                              GBS:   Contraception  Desires tubal, understands may not be possible with COVID-19 outbreak Conceived this pregnancy with IUD Pap: 08/05/2018 NIL HPV positive  CBB     CS/VBAC  hx vaginal deliveries  pyelo in pregnancy  Support Person            Rh negative state in antepartum period 08/27/2018 by Vena Austria, MD No       Gestational age appropriate obstetric precautions including but not limited to vaginal bleeding, contractions, leaking of fluid and fetal movement were reviewed in detail with the patient.    Discussed COVID-19 related issues Growth [place] next visit for size date discrepancy  Discussed tubal ligation which may not be able to be performed. Would like to sign consents at next visit.  28 wk labs at next visit.  TOC urine culture sent today- patient is continuing daily suppression with macrobid. No complaints.   Return in about 4 weeks (around 01/06/2019) for [place], ROB and 1GTT.  Natale Milch MD [facility], [facility] 12/09/2018, 6:08 PM

## 2018-12-10 DIAGNOSIS — Z87448 Personal history of other diseases of urinary system: Secondary | ICD-10-CM | POA: Diagnosis not present

## 2018-12-12 LAB — URINE CULTURE: Organism ID, Bacteria: NO GROWTH

## 2019-01-06 ENCOUNTER — Encounter: Payer: Self-pay | Admitting: Obstetrics and Gynecology

## 2019-01-06 ENCOUNTER — Ambulatory Visit (INDEPENDENT_AMBULATORY_CARE_PROVIDER_SITE_OTHER): Payer: BLUE CROSS/BLUE SHIELD | Admitting: Obstetrics and Gynecology

## 2019-01-06 ENCOUNTER — Other Ambulatory Visit: Payer: Self-pay

## 2019-01-06 ENCOUNTER — Other Ambulatory Visit: Payer: BLUE CROSS/BLUE SHIELD

## 2019-01-06 ENCOUNTER — Ambulatory Visit (INDEPENDENT_AMBULATORY_CARE_PROVIDER_SITE_OTHER): Payer: BLUE CROSS/BLUE SHIELD

## 2019-01-06 VITALS — BP 110/70 | Wt 221.0 lb

## 2019-01-06 DIAGNOSIS — Z3A28 28 weeks gestation of pregnancy: Secondary | ICD-10-CM

## 2019-01-06 DIAGNOSIS — Z6791 Unspecified blood type, Rh negative: Secondary | ICD-10-CM

## 2019-01-06 DIAGNOSIS — O26893 Other specified pregnancy related conditions, third trimester: Secondary | ICD-10-CM

## 2019-01-06 DIAGNOSIS — O26849 Uterine size-date discrepancy, unspecified trimester: Secondary | ICD-10-CM

## 2019-01-06 DIAGNOSIS — Z3480 Encounter for supervision of other normal pregnancy, unspecified trimester: Secondary | ICD-10-CM | POA: Diagnosis not present

## 2019-01-06 DIAGNOSIS — O26899 Other specified pregnancy related conditions, unspecified trimester: Secondary | ICD-10-CM

## 2019-01-06 DIAGNOSIS — O26843 Uterine size-date discrepancy, third trimester: Secondary | ICD-10-CM | POA: Diagnosis not present

## 2019-01-06 LAB — POCT URINALYSIS DIPSTICK OB

## 2019-01-06 MED ORDER — RHO D IMMUNE GLOBULIN 1500 UNIT/2ML IJ SOSY
300.0000 ug | PREFILLED_SYRINGE | Freq: Once | INTRAMUSCULAR | Status: AC
  Administered 2019-01-06: 11:00:00 300 ug via INTRAMUSCULAR

## 2019-01-06 NOTE — Progress Notes (Signed)
Routine Prenatal Care Visit  Subjective  Cheyenne Gomez is a 28 y.o. [phone] at [redacted]w[redacted]d being seen today for ongoing prenatal care.  She is currently monitored for the following issues for this low-risk pregnancy and has Calculus of gallbladder with biliary obstruction but without cholecystitis; Supervision of other normal pregnancy, antepartum; Rh negative state in antepartum period; Obesity in pregnancy, antepartum; and Pyelonephritis affecting pregnancy in second trimester on their problem list.  ----------------------------------------------------------------------------------- Patient reports no complaints.   Contractions: Not present. Vag. Bleeding: None.  Movement: Present. Denies leaking of fluid.  ----------------------------------------------------------------------------------- The following portions of the patient's history were reviewed and updated as appropriate: allergies, current medications, past family history, past medical history, past social history, past surgical history and problem list. Problem list updated.   Objective  Blood pressure 110/70, weight 221 lb (100.2 kg), last menstrual period 06/15/2018. Pregravid weight 207 lb (93.9 kg) Total Weight Gain 14 lb (6.35 kg) Urinalysis:      Fetal Status: Fetal Heart Rate (bpm): 146   Movement: Present  Presentation: Vertex  General:  Alert, oriented and cooperative. Patient is in no acute distress.  Skin: Skin is warm and dry. No rash noted.   Cardiovascular: Normal heart rate noted  Respiratory: Normal respiratory effort, no problems with respiration noted  Abdomen: Soft, gravid, appropriate for gestational age. Pain/Pressure: Absent     Pelvic:  Cervical exam deferred        Extremities: Normal range of motion.     Mental Status: Normal mood and affect. Normal behavior. Normal judgment and thought content.     Assessment   28 y.o. [id number] at [redacted]w[redacted]d by  03/28/2019, by Ultrasound presenting for routine  prenatal visit  Plan   Pregnancy #4 Problems (from 08/15/18 to present)    Problem Noted Resolved   Obesity in pregnancy, antepartum 09/12/2018 by Natale Milch, MD No   Supervision of other normal pregnancy, antepartum 08/27/2018 by Vena Austria, MD No   Overview Addendum 01/06/2019 10:16 AM by Natale Milch, MD    [facility]inic [facility] Prenatal [facility]  Dating 7 week [place] Blood type: A negative  Genetic Screen Declines Antibody: negative  Anatomic [place] complete Rubella: Immune Varicella: Immune  GTT Early: hgbA1C 5.5 Third trimester:  RPR: NR  Rhogam  [x ] 28 weeks-01/06/2019 HBsAg: negative  TDaP vaccine                        Flu Shot: 09/12/2018 HIV: negative  Baby Food  Breast- experienced                              GBS:   Contraception  Desires tubal, understands may not be possible with COVID-19 outbreak Conceived this pregnancy with IUD- consent signed Pap: 08/05/2018 NIL HPV positive  CBB     CS/VBAC  hx vaginal deliveries  pyelo in pregnancy  Support Person            Rh negative state in antepartum period 08/27/2018 by Vena Austria, MD No       Gestational age appropriate obstetric precautions including but not limited to vaginal bleeding, contractions, leaking of fluid and fetal movement were reviewed in detail with the patient.    Infant growth 71% today, consider repeating at 32 and 36 weeks tubal consents today rhogam today 28 wk labs today Continue daily macrobid for hx of pyelonephritis this pregnant, recent urine culture  normal.   Return in about 2 weeks (around 01/20/2019) for ROB in person.  Natale Milchhristanna R Joie Hipps MD [facility], [facility] 01/06/2019, 10:21 AM

## 2019-01-06 NOTE — Addendum Note (Signed)
Addended by: Desmond Dike on: 01/06/2019 11:08 AM   Modules accepted: Orders

## 2019-01-06 NOTE — Progress Notes (Signed)
ROB No concerns  Rhogam Shot today Would like tubal consent today  Good FM, no vb, No lof

## 2019-01-07 LAB — 28 WEEKS RH-PANEL
Antibody Screen: NEGATIVE
Basophils Absolute: 0 10*3/uL (ref 0.0–0.2)
Basos: 0 %
EOS (ABSOLUTE): 0.1 10*3/uL (ref 0.0–0.4)
Eos: 1 %
Gestational Diabetes Screen: 201 mg/dL — ABNORMAL HIGH (ref 65–139)
HIV Screen 4th Generation wRfx: NONREACTIVE
Hematocrit: 34.2 % (ref 34.0–46.6)
Hemoglobin: 11.8 g/dL (ref 11.1–15.9)
Immature Grans (Abs): 0.1 10*3/uL (ref 0.0–0.1)
Immature Granulocytes: 1 %
Lymphocytes Absolute: 1.6 10*3/uL (ref 0.7–3.1)
Lymphs: 19 %
MCH: 30.2 pg (ref 26.6–33.0)
MCHC: 34.5 g/dL (ref 31.5–35.7)
MCV: 88 fL (ref 79–97)
Monocytes Absolute: 0.4 10*3/uL (ref 0.1–0.9)
Monocytes: 5 %
Neutrophils Absolute: 6.5 10*3/uL (ref 1.4–7.0)
Neutrophils: 74 %
Platelets: 217 10*3/uL (ref 150–450)
RBC: 3.91 x10E6/uL (ref 3.77–5.28)
RDW: 13 % (ref 11.7–15.4)
RPR Ser Ql: NONREACTIVE
WBC: 8.7 10*3/uL (ref 3.4–10.8)

## 2019-01-10 ENCOUNTER — Other Ambulatory Visit: Payer: Self-pay | Admitting: Obstetrics and Gynecology

## 2019-01-10 DIAGNOSIS — R7309 Other abnormal glucose: Secondary | ICD-10-CM

## 2019-01-10 NOTE — Progress Notes (Signed)
Pt aware. And on schedule for 5/12.

## 2019-01-10 NOTE — Progress Notes (Signed)
This patient needs a 3 hr glucose test. Please call and notify

## 2019-01-14 ENCOUNTER — Other Ambulatory Visit: Payer: BLUE CROSS/BLUE SHIELD

## 2019-01-14 ENCOUNTER — Other Ambulatory Visit: Payer: Self-pay

## 2019-01-14 DIAGNOSIS — R7309 Other abnormal glucose: Secondary | ICD-10-CM

## 2019-01-15 ENCOUNTER — Other Ambulatory Visit: Payer: Self-pay | Admitting: Obstetrics and Gynecology

## 2019-01-15 DIAGNOSIS — O24419 Gestational diabetes mellitus in pregnancy, unspecified control: Secondary | ICD-10-CM

## 2019-01-15 LAB — GESTATIONAL GLUCOSE TOLERANCE
Glucose, Fasting: 100 mg/dL — ABNORMAL HIGH (ref 65–94)
Glucose, GTT - 1 Hour: 221 mg/dL — ABNORMAL HIGH (ref 65–179)
Glucose, GTT - 2 Hour: 119 mg/dL (ref 65–154)
Glucose, GTT - 3 Hour: 102 mg/dL (ref 65–139)

## 2019-01-15 NOTE — Progress Notes (Signed)
Can you please call Cheyenne Gomez and let her know that her glucose test showed that she does have gestational diabetes. I would like her to have a visit this week if possible to go over gestational diabetes and checking blood sugars.  Thank you,  Dr. Jerene Pitch

## 2019-01-16 NOTE — Progress Notes (Signed)
Pt aware and is scheduled for tomorrow 01/17/19.

## 2019-01-17 ENCOUNTER — Encounter: Payer: BLUE CROSS/BLUE SHIELD | Admitting: Obstetrics and Gynecology

## 2019-01-22 ENCOUNTER — Encounter: Payer: Self-pay | Admitting: Obstetrics and Gynecology

## 2019-01-22 ENCOUNTER — Ambulatory Visit (INDEPENDENT_AMBULATORY_CARE_PROVIDER_SITE_OTHER): Payer: BLUE CROSS/BLUE SHIELD | Admitting: Obstetrics and Gynecology

## 2019-01-22 ENCOUNTER — Other Ambulatory Visit: Payer: Self-pay

## 2019-01-22 VITALS — BP 122/60 | Wt 223.0 lb

## 2019-01-22 DIAGNOSIS — Z3A30 30 weeks gestation of pregnancy: Secondary | ICD-10-CM

## 2019-01-22 DIAGNOSIS — Z23 Encounter for immunization: Secondary | ICD-10-CM

## 2019-01-22 DIAGNOSIS — Z6791 Unspecified blood type, Rh negative: Secondary | ICD-10-CM

## 2019-01-22 DIAGNOSIS — O0940 Supervision of pregnancy with grand multiparity, unspecified trimester: Secondary | ICD-10-CM

## 2019-01-22 DIAGNOSIS — O24419 Gestational diabetes mellitus in pregnancy, unspecified control: Secondary | ICD-10-CM

## 2019-01-22 DIAGNOSIS — O0993 Supervision of high risk pregnancy, unspecified, third trimester: Secondary | ICD-10-CM

## 2019-01-22 DIAGNOSIS — O26893 Other specified pregnancy related conditions, third trimester: Secondary | ICD-10-CM

## 2019-01-22 MED ORDER — ACCU-CHEK FASTCLIX LANCETS MISC: 1.0000 | Freq: Four times a day (QID) | 12 refills | Status: DC

## 2019-01-22 MED ORDER — ACCU-CHEK GUIDE ME W/DEVICE KIT: 1.0000 | PACK | Freq: Four times a day (QID) | 0 refills | Status: DC

## 2019-01-22 MED ORDER — GLUCOSE BLOOD VI STRP: ORAL_STRIP | 12 refills | Status: DC

## 2019-01-22 NOTE — Progress Notes (Signed)
ROB Tdap/BT consent today with interpreter Jacqui  No concerns  Denies lof, no vb, no contractions

## 2019-01-22 NOTE — Patient Instructions (Signed)
Diabetes mellitus gestacional, cuidados personales Gestational Diabetes Mellitus, Self Care El cuidado personal despus del diagnstico de diabetes gestacional (diabetes mellitus gestacional) implica mantener el nivel de azcar en la sangre (glucosa) bajo control. Es posible Nature conservation officer logrando un equilibrio entre los siguientes factores:  Alimentacin.  Actividad fsica.  Cambios en el estilo de vida.  Medicamentos o insulina, si es necesario.  Apoyo del equipo de mdicos y de Producer, television/film/video. La siguiente informacin explica lo que debe saber para mantener la diabetes gestacional bajo control en su casa. Cules son los riesgos? Si la diabetes gestacional se trata, hay pocas probabilidades de que ocasione problemas. Si no se la controla con tratamiento, puede causar problemas durante el [place] de parto y [place], y algunos de esos problemas pueden ser dainos para el beb en gestacin (feto) y Therapist, nutritional. La diabetes gestacional que no se controla tambin puede producir problemas respiratorios y bajo nivel de glucemia en el recin nacido. Las mujeres que tienen diabetes gestacional son ms propensas a Lawyer afeccin si se embarazan de nuevo y a tener diabetes tipo 2 en el futuro. Cmo controlar el nivel de glucemia   Contrlese la glucemia todos los das durante el [place]. Haga esto con la frecuencia que le haya indicado el mdico.  Comunquese con el mdico si la glucemia est por encima de su valor ideal en dos pruebas consecutivas. El [employer] objetivos personalizados de su tratamiento. Generalmente, el objetivo del tratamiento es [employer] siguientes niveles de glucemia durante el embarazo:  Antes de las comidas (preprandial): valor de 95 mg/dl (5,3 mmol/l) o inferior.  Despus de las comidas (posprandial): ? Una hora despus de una comida: igual o menor que 140 mg/dl (7,8 mmol/l). ? Dos horas despus de una comida: igual o menor que 120 mg/dl (6,7 mmol/l).   Nivel de A1c (hemoglobina A1c): del 6 % al 6,5 %. Cmo controlar la hiperglucemia y la hipoglucemia Sntomas de hiperglucemia La hiperglucemia, tambin denominada glucemia alta, ocurre cuando la glucemia es demasiado alta. Asegrese de [employer] signos tempranos de hiperglucemia, por ejemplo:  Aumento de la sed.  Hambre.  Mucho cansancio.  Necesidad de Garment/textile technologist con mayor frecuencia que lo habitual.  Visin borrosa. Sntomas de hipoglucemia La hipoglucemia, tambin llamada glucemia baja, ocurre cuando el nivel de glucemia es igual o menor que 70 mg/dl (3,9 mmol/l). El riesgo de hipoglucemia aumenta durante o despus de Optometrist actividad fsica, mientras duerme, cuando est enferma o si se saltea comidas o no come [employer] (ayuna). Entre los sntomas, se pueden incluir los siguientes:  [place].  Ansiedad.  Sudoracin y piel hmeda.  Confusin.  Mareos o sensacin de desvanecimiento.  Somnolencia.  Nuseas.  Aumento de la frecuencia cardaca.  Dolor de [place].  Visin borrosa.  Irritabilidad.  Hormigueo o adormecimiento alrededor de la boca, labios o lengua.  Cambios en la coordinacin.  Sueo agitado.  Desmayos.  Convulsiones. Es Photographer los sntomas de la hipoglucemia y tratarla de inmediato. Lleve siempre con usted un refrigerio que contenga 15 gramos de hidratos de carbono de accin rpida para tratar la glucemia baja. Sus familiares y amigos cercanos tambin deben [employer] sntomas, y comprender cmo tratar la hipoglucemia, en caso de que usted no pueda tratarse a s mismo. Tratamiento de la hipoglucemia Si est alerta y puede tragar de Geographical information systems officer segura, siga la regla 15/15, que consiste en lo siguiente:  Consuma 15 gramos de hidratos de carbono de accin rpida. Hable con su mdico acerca  de cunto debera consumir.  Las opciones de accin rpida incluyen: ? Pastillas de glucosa (consuma 15 gramos). ? De 6 a 8 unidades de caramelos  duros. ? De 4 a 6 onzas (de 120 a 150 ml) de jugo de frutas. ? De 4 a 6 onzas (de 120 a 150 ml) de refresco comn (no diettico). ? 1 cucharada (15 ml) de miel o azcar.  Contrlese la glucemia 15 minutos despus de ingerir el hidrato de carbono.  Si este nuevo nivel de glucemia todava es igual o menor que 70 mg/dl (3,9 mmol/l), ingiera nuevamente 15 gramos de un hidrato de carbono.  Si el nivel de glucemia no supera los 70 mg/dl (3,9 mmol/l) despus de 3 intentos, solicite ayuda de inmediato.  Ingiera una comida o una colacin en el transcurso de 1 hora despus de que su nivel de glucemia se haya normalizado. Tratamiento de la hipoglucemia grave La hipoglucemia se considera grave cuando su nivel de glucemia es igual o menor que 54 mg/dl (3 mmol/l). La hipoglucemia grave es Engineer, maintenance (IT). No espere a ver si los sntomas desaparecen. Solicite atencin mdica de inmediato. Comunquese con el servicio de emergencias de su localidad (911 en los [place]). Si tiene hipoglucemia grave y no puede ingerir ningn alimento ni bebida, tal vez deba aplicarse una inyeccin de glucagn. Un familiar o un amigo deben aprender a controlarle el nivel de glucemia y a aplicarle una inyeccin de glucagn. Pregntele al mdico si debera tener disponible un kit de inyecciones de glucagn de Freight forwarder. Es posible que la hipoglucemia grave deba tratarse en un hospital. El tratamiento puede incluir la administracin de glucosa a travs de una va intravenosa. Tambin puede necesitar un tratamiento para tratar la afeccin que est causando la hipoglucemia. Siga estas indicaciones en su casa: Tome sus medicamentos para la diabetes segn las indicaciones.  Si el mdico le recet insulina o medicamentos para la diabetes, tmelos [employer].  No se quede sin insulina ni sin cualquier otro medicamento para la diabetes que tome. Planifique con antelacin para tenerlos siempre a su disposicin.  Si [place] insulina,  ajuste las dosis de insulina en funcin de la cantidad de actividad fsica que realiza y de los alimentos que consume. El mdico le indicar cmo ajustar su dosis. Elija alimentos saludables  [employer] come y bebe tienen una incidencia en la glucemia (y en las dosis de [place], si corresponde). Hacer buenas elecciones ayuda a mantener la diabetes bajo control y a Tax inspector de salud. Un plan de alimentacin saludable incluye consumir protenas magras, hidratos de carbono complejos, frutas y verduras frescas, productos lcteos con bajo contenido de [place] y grasas saludables. Programe una cita con un especialista en alimentacin y nutricin (nutricionista certificado) para que la ayude a Paediatric nurse un plan de alimentacin adecuado para usted. Asegrese de lo siguiente:  Siga las indicaciones del mdico respecto de las restricciones en las comidas o las bebidas.  Beba suficiente lquido como para Theatre manager la orina de color amarillo plido.  Coma refrigerios saludables entre comidas nutritivas.  Mantenga un registro de los hidratos de carbono que consume. Para hacerlo, lea las etiquetas de informacin nutricional y aprenda cules son los tamaos estndares de las porciones de los alimentos.  Siga el plan para los das de enfermedad cuando no pueda comer ni beber normalmente. Elabore este plan por adelantado con el mdico.  Mantngase activa  Haga 30 minutos o ms de actividad fsica por da, o tanta actividad fsica como  le recomiende el mdico durante el [place]. ? Hacer 10 minutos de actividad fsica a partir de 30 minutos despus de cada comida puede ayudar a [place] de glucemia posprandial.  Si comienza un ejercicio o una actividad nuevos, trabaje con el mdico para ajustar la insulina, los medicamentos o la ingesta de comidas segn sea necesario. Opte por un estilo de vida saludable  No beba alcohol.  No consuma ningn producto que contenga tabaco, lo que  incluye cigarrillos, tabaco de Higher education careers adviser y Psychologist, sport and exercise. Si necesita ayuda para dejar de consumir, consulta al mdico.  Aprenda a manejar el estrs. Si necesita ayuda para lograrlo, consulte a su mdico. Cuide su cuerpo  Mantngase al da con las vacunas.  Lvese los dientes y [place] veces por da, y psese hilo dental una o ms veces por Training and development officer. Visite al dentista una vez cada 6 meses o con ms frecuencia.  Mantenga un peso Tax adviser. Indicaciones generales  [employer] de venta libre y los recetados solamente como se lo haya indicado el mdico.  Hable con el mdico sobre el riesgo de tener presin arterial alta durante el embarazo (preeclampsia o eclampsia).  Comparta su plan de control de la diabetes con sus compaeros de trabajo y de Cytogeneticist, y con las personas con las que [place].  Controle el nivel de cetonas en la orina durante el embarazo cuando est enferma o como se lo haya indicado el mdico.  Lleve con usted una tarjeta o use un brazalete o una medalla que indiquen que tiene diabetes gestacional.  Asista a todas las visitas de control durante el embarazo (prenatales) y despus del parto (posnatales) como se lo haya indicado el mdico. Esto es importante. Procure recibir la atencin que necesita despus del parto  Hgase controlar la glucemia de 4 a 12 semanas despus del parto. Esto se hace con una prueba de tolerancia a la glucosa oral (PTGO).  Hgase controles de deteccin de la diabetes al [employer] cada 3 aos o con la frecuencia que le haya indicado el mdico. Preguntas para hacerle al mdico  Es necesario que me rena con un instructor en el cuidado de la diabetes?  Dnde puedo encontrar un grupo de 29 para mujeres con diabetes gestacional? Dnde buscar ms informacin Para obtener ms informacin sobre la diabetes gestacional, visite los siguientes sitios web:  American Diabetes Association (ADA) (Asociacin  Estadounidense de la Diabetes): www.diabetes.org  Centers for Disease Control and Prevention Librarian, academic) (Centros para Building surveyor y Publishing copy de Arboriculturist): http://www.wolf.info/ Resumen  Contrlese la glucemia todos los das durante el [place]. Haga esto con la frecuencia que le haya indicado el mdico.  Si el mdico le recet insulina o medicamentos para la diabetes, adminstreselos todos los das segn las indicaciones.  Asista a todas las visitas de control durante el embarazo (prenatales) y despus del parto (posnatales) como se lo haya indicado el mdico. Esto es importante.  Hgase controlar la glucemia de 4 a 12 semanas despus del parto. Esta informacin no tiene Marine scientist el consejo del mdico. Asegrese de hacerle al mdico cualquier pregunta que tenga. Document Released: 12/13/2015 Document Revised: 04/05/2018 Document Reviewed: 04/05/2018 Elsevier Interactive Patient Education  2019 [employer]. Diabetes mellitus gestacional, diagnstico Gestational Diabetes Mellitus, Diagnosis La diabetes gestacional (diabetes mellitus gestacional) es una forma temporal de diabetes que algunas mujeres desarrollan durante el Media planner. Ocurre generalmente alrededor de la semana 24 a 28de gestacin y desaparece despus del parto. Los cambios hormonales  durante el embarazo pueden interferir en la produccin y la actividad de la [place], lo que puede derivar en uno de estos problemas o en ambos:  El pncreas no produce la cantidad suficiente de una hormona llamada insulina.  Las clulas del cuerpo no responden de [place] a la insulina que el organismo produce (resistencia a la insulina). Normalmente, la insulina permite que el azcar en la sangre (glucosa) ingrese a las clulas del cuerpo. Las clulas usan la glucosa para Dealer. La resistencia a la insulina o la falta de insulina hacen que el exceso de glucosa se acumule en la sangre, en lugar de ir a las clulas. Como  resultado, aumenta la glucemia (hiperglucemia). Si la diabetes gestacional se trata, no es probable que [employer]. Si no se la controla con tratamiento, puede causar problemas durante el [place] de parto y [place], y algunos de esos problemas pueden ser dainos para el beb en gestacin (feto) y Therapist, nutritional. Las mujeres que tienen diabetes gestacional son ms propensas a Lawyer afeccin si se embarazan de nuevo y a tener diabetes tipo2 en el futuro. Qu incrementa el riesgo? Es ms probable que Orthoptist en las mujeres embarazadas que:  Son [place] de 25aos durante el [place].  Tiene antecedentes familiares de diabetes.  Tiene sobrepeso.  Tuvieron diabetes gestacional en el pasado.  Tienen el sndrome del ovario poliqustico (SOP).  Tienen un embarazo gemelar o un embarazo mltiple.  Son descendientes de indgenas norteamericanos, afroamericanos, hispanos o latinos, o asiticos o isleos del Pacfico. Cules son los signos o los sntomas? La mayora de las mujeres no perciben los sntomas de la diabetes gestacional porque son similares a los sntomas normales del Media planner. Los sntomas de la diabetes gestacional pueden incluir, entre otros:  Aumento de la sed (polidipsia).  Aumento del apetito (polifagia).  Aumento de la miccin (poliuria). Cmo se diagnostica? Esta afeccin se puede diagnosticar en funcin del nivel de glucemia que puede medirse con uno o ms de los siguientes anlisis de sangre:  Prueba de la glucemia en ayunas. No se le permitir comer (tendr que hacer ayuno) durante 8horas o ms antes de que se le tome una [place].  Prueba al azar de la glucemia. Esta prueba mide su nivel de glucemia en cualquier momento del da, sin importar cundo comi.  Prueba de tolerancia a la glucosa oral (PTGO). Generalmente, se realiza [place] [employer] 24 a 28de gestacin. ? Para esta prueba, le harn una medicin de la glucemia en  ayunas. Luego, tomar una bebida que contiene glucosa. Se analizar el nivel de glucemia nuevamente unahora despus de tomar la bebida con glucosa (PTGO a la hora). ? Si el resultado de la PTGO a la hora es igual o superior a 118m/dl (7,827ml/l), le repetirn la PTGO. Esta vez, se analizar el nivel de glucemia 3horas despus de tomar la bebida con glucosa (PTGO a las 3horas). Si tiene factores de riSpring Gappueden hacerle pruebas de deteccin de la diabetes tipo2 no diagnosticada en la primera visita de atencin mdMedical sales representativevisita prenatal). Cmo se trata?     El tratamiento puede estar a cargo de unResearch officer, trade unionPara tratar esta afeccin, debe seguir las indicaciones del mdico respecto de lo siguiente:  Comer una dieta saludable y aumentar la actividad fsica. Estos cambios son lo ms importante para mantener la diabetes gestacional bajo control.  Controlarse la glucemia. Hgalo con la frecuencia que le hayan indicado.  Tomar  los medicamentos para la diabetes o aplicarse la [employer]. Estos frmacos se recetarn solamente si son necesarios. ? Si [place] insulina, tal vez deba ajustar las dosis en funcin de la cantidad de actividad fsica que realiza y de los alimentos que consume. El mdico le indicar cmo hacerlo. El mdico [employer] objetivos del tratamiento para usted sobre la base de la etapa del Media planner en la que se encuentra y de cualquier otra afeccin que padezca. Generalmente, el objetivo del tratamiento es [employer] siguientes niveles de glucemia durante el embarazo:  Antes de las comidas (preprandial): valor de 54m/dl (5,373ml/l) o inferior.  Despus de las comidas (posprandial): ? Una hora despus de una comida: igual o menor que 14050ml (7,8mm48ml). ? Dos horas despus de una comida: igual o menor que 120mg14m(6,7mmol56m.  Nivel de A1c (hemoglobinaA1c): del 6% al 6,5%. Siga estas indicaciones en su  casa: Preguntas para hacerle al mdico  Considere la posibilidad de hacer las siguientes preguntas: ? Es necesario que consulte a un insRadio broadcast assistant cuidado de la diabetes? ? Qu equipos necesitar para controlar la diabetes en casa? ? Qu medicamentos para la diabetes necesito y cundo debo tomarlos? ? Con qu frecuencia debo controlarme la glucemia? ? A qu nmero puedo llamar si tengo preguntas? ? Cundo es mi prxima cita? Instrucciones generales  Tome lDelphinta libre y los recetados solamente como se lo haya indicado el mdico.  Controle el aumento de peso durante el embaraCastleton Four Cornersantidad de peso qWashington Mutualpera que aumente depende de su IMC (nRed Banke de masa corporal) antes del embarazo.  Concurra a todas las visitas de control como se lo haya indicado el mdico. Esto es importante.  Para obtener ms informacin sobre la diabetes, visite los siguientes sitios web: ? AsociaSystems analyst Diabetes (American Diabetes Association, ADA): www.diabetes.org ? Asociacin Estadounidense de Instructores para el Cuidado de la Diabetes (American Association of Diabetes Educators, AADE): www.diabeteseducator.org Comunquese con un mdico si:  El nivel de glucemia es igual o superior a 240mg/d46m3,3mmol/l10m El nivel de glucemia es igual o superior a 200mg/dl 32m1mmol/l),41mtiene cetonas en la orina.  Ha estado enfermo o ha tenido fiebre durante 2das o ms, y no mejora.  Tiene alguno de los siguientes problemas durante ms de 6horas: ? No puede comer ni beber. ? Tiene nuseas y vmitos. ? Tiene diarrea. Solicite ayuda de inmediato si:  Su nivel de glucemia est por debajo de 54mg/dl (353m/l).  19msiente confundido o tiene dificultad para pensar con claridad.  Tiene dificultad para respirar.  Tiene un nivel moderado o alto de cetonas en la orina.  El [place] mueve menos de lo habitual.  Tiene secreciones inusuales o sangrado de la vagina.   Comienza a tener contracciones antes de tiempo (prematuramente). Las contracciones se pueden sentir como un endurecimiento de la parte inferior del abdomen. Resumen  La diabetes gestacional (diabetes mellitus gestacional) es una forma temporal de diabetes que algunas mujeres desarrollan durante el embarazo. Ocurre generalmente alrededor de la semana 24 a 28de gestacin y desaparece despus del parto.  Esta afeccin se trata haciendo cambios en la dieta y el estilo de vida adems de tomar medicamentos para la diabetes o insulina, de ser necesario.  Las mujeres que tienen diabetes gestacional son ms propensas a desarrollar Lawyerse embarazan de nuevo y a tener diabetes tipo2 en el futuro. Esta informacin no tiene como fin reeMarine scientistdel mdico. AsegreseChief Strategy Officer  de hacerle al mdico cualquier pregunta que tenga. Document Released: 05/31/2005 Document Revised: 08/22/2017 Document Reviewed: 09/24/2015 Elsevier Interactive Patient Education  2019 [employer].

## 2019-01-22 NOTE — Progress Notes (Signed)
Routine Prenatal Care Visit  Subjective  Cheyenne Gomez is a 28 y.o. [phone] at [redacted]w[redacted]d being seen today for ongoing prenatal care.  She is currently monitored for the following issues for this high-risk pregnancy and has Calculus of gallbladder with biliary obstruction but without cholecystitis; Supervision of high-risk pregnancy; Rh negative state in antepartum period; Obesity in pregnancy, antepartum; Pyelonephritis affecting pregnancy in second trimester; and Gestational diabetes mellitus (GDM) affecting fourth pregnancy on their problem list.  ----------------------------------------------------------------------------------- Patient reports no complaints.   Contractions: Not present. Vag. Bleeding: None.  Movement: Present. Denies leaking of fluid.  ----------------------------------------------------------------------------------- The following portions of the patient's history were reviewed and updated as appropriate: allergies, current medications, past family history, past medical history, past social history, past surgical history and problem list. Problem list updated.   Objective  Blood pressure 122/60, weight 223 lb (101.2 kg), last menstrual period 06/15/2018. Pregravid weight 207 lb (93.9 kg) Total Weight Gain 16 lb (7.258 kg) Urinalysis:      Fetal Status: Fetal Heart Rate (bpm): 150 Fundal Height: 37 cm Movement: Present     General:  Alert, oriented and cooperative. Patient is in no acute distress.  Skin: Skin is warm and dry. No rash noted.   Cardiovascular: Normal heart rate noted  Respiratory: Normal respiratory effort, no problems with respiration noted  Abdomen: Soft, gravid, appropriate for gestational age. Pain/Pressure: Absent     Pelvic:  Cervical exam deferred        Extremities: Normal range of motion.  Edema: None  Mental Status: Normal mood and affect. Normal behavior. Normal judgment and thought content.     Assessment   28 y.o. [id number] at  [redacted]w[redacted]d by  03/28/2019, by Ultrasound presenting for routine prenatal visit  Plan   Pregnancy #4 Problems (from 08/15/18 to present)    Problem Noted Resolved   Gestational diabetes mellitus (GDM) affecting fourth pregnancy 01/15/2019 by Natale Milch, MD No   Overview Signed 01/15/2019 11:17 AM by Natale Milch, MD    Current Diabetic Medications:  None  [ ]  Aspirin 81 mg daily after 12 weeks; discontinue after 36 weeks (? A2/B GDM)  Required Referrals for A1GDM or A2GDM: [ ]  Diabetes Education and Testing Supplies [ ]  Nutrition Cousult  For A2/B GDM or higher classes of DM [ ]  Diabetes Education and Testing Supplies [ ]  Nutrition Counsult [ ]  Fetal ECHO after 22-24 weeks  [ ]  Eye exam for retina evaluation  [ ]  Baseline EKG [ ]  US fetal growth every 4 weeks starting at 28 weeks [ ]  Twice weekly NST starting at [redacted] weeks gestation [ ]  Delivery planning contingent on fetal growth, AFI, glycemic control, and other co-morbidities but at least by 39 weeks  Baseline and surveillance labs (pulled in from [facility], refresh links as needed)  Lab Results  Component Value Date   CREATININE 0.51 11/04/2018   AST 22 07/30/2018   ALT 24 07/30/2018   LABPROT 12.6 11/04/2018   Lab Results  Component Value Date   HGBA1C 5.5 10/14/2018    Antenatal Testing Class of DM U/S NST/AFI DELIVERY  Diabetes   A1 - good control - O24.410    A2 - good control - O24.419      A2  - poor control or poor compliance - O24.419, E11.65   (Macrosomia or polyhydramnios) **E11.65 is extra code for poor control**    A2/B - O24.919  and B-C O24.319  Poor control B-C or D-R-F-T - O24.319  or  Type I DM - O24.019  20-38  20-38  20-24-28-32-36   20-24-28-32-35-38//fetal echo  20-24-27-30-33-36-38//fetal echo  40  32//2 x wk  32//2 x wk   32//2 x wk  28//BPP wkly then 32//2 x wk  40  39  PRN   39  PRN          Obesity in pregnancy, antepartum 09/12/2018 by Natale MilchSchuman,  Demika Langenderfer R, MD No   Supervision of high-risk pregnancy 08/27/2018 by Vena AustriaStaebler, Andreas, MD No   Overview Addendum 01/22/2019  9:41 AM by Natale MilchSchuman, [name] R, MD    Clinic [facility] Prenatal [facility]  Dating 7 week US Blood type: A negative  Genetic Screen Declines Antibody: negative  Anatomic US complete Rubella: Immune Varicella: Immune  GTT Early: hgbA1C 5.5 Third trimester: ELEVATED 3hr RPR: NR  Rhogam  [x ] 28 weeks-01/06/2019 HBsAg: negative  TDaP vaccine    01/22/2019                    Flu Shot: 09/12/2018 HIV: negative  Baby Food  Breast- experienced                              GBS:   Contraception  Desires tubal, understands may not be possible with COVID-19 outbreak Conceived this pregnancy with IUD- consent signed Pap: 08/05/2018 NIL HPV positive  CBB     CS/VBAC  hx vaginal deliveries  pyelo in pregnancy  Support Person            Rh negative state in antepartum period 08/27/2018 by Vena AustriaStaebler, Andreas, MD No       Gestational age appropriate obstetric precautions including but not limited to vaginal bleeding, contractions, leaking of fluid and fetal movement were reviewed in detail with the patient.    Discussed gestational diabetes and the importance of compliance with care.  She made appointment with diabetic/ nutrition today while in office. Rx for glucometer, strips and lancets sent- will follow up Friday for teaching so she can learn to use machine.  Given glucose log book to start tracking blood glucose levels fasting and 2 hours after meals. Discussed dietary modifications.  TDAP today  Return in about 2 days (around 01/24/2019) for ROB in person with interrpretor.  Natale Milchhristanna R Jehu Mccauslin MD [facility], [facility] 01/22/2019, 9:41 AM

## 2019-01-24 ENCOUNTER — Ambulatory Visit (INDEPENDENT_AMBULATORY_CARE_PROVIDER_SITE_OTHER): Payer: BLUE CROSS/BLUE SHIELD | Admitting: Maternal Newborn

## 2019-01-24 ENCOUNTER — Encounter: Payer: Self-pay | Admitting: Maternal Newborn

## 2019-01-24 ENCOUNTER — Other Ambulatory Visit: Payer: Self-pay

## 2019-01-24 VITALS — BP 100/60 | Wt 221.8 lb

## 2019-01-24 DIAGNOSIS — O24419 Gestational diabetes mellitus in pregnancy, unspecified control: Secondary | ICD-10-CM

## 2019-01-24 DIAGNOSIS — O24410 Gestational diabetes mellitus in pregnancy, diet controlled: Secondary | ICD-10-CM

## 2019-01-24 DIAGNOSIS — O26893 Other specified pregnancy related conditions, third trimester: Secondary | ICD-10-CM

## 2019-01-24 DIAGNOSIS — Z6791 Unspecified blood type, Rh negative: Secondary | ICD-10-CM

## 2019-01-24 DIAGNOSIS — Z3A31 31 weeks gestation of pregnancy: Secondary | ICD-10-CM

## 2019-01-24 LAB — POCT URINALYSIS DIPSTICK OB: Glucose, UA: NEGATIVE

## 2019-01-24 MED ORDER — ACCU-CHEK SOFT TOUCH LANCETS MISC: 12 refills | Status: DC

## 2019-01-24 NOTE — Progress Notes (Signed)
Routine Prenatal Care Visit  Subjective  Cheyenne Gomez is a 28 y.o. [phone] at [redacted]w[redacted]d being seen today for ongoing prenatal care.  She is currently monitored for the following issues for this high-risk pregnancy and has Calculus of gallbladder with biliary obstruction but without cholecystitis; Supervision of high-risk pregnancy; Rh negative state in antepartum period; Obesity in pregnancy, antepartum; Pyelonephritis affecting pregnancy in second trimester; and Gestational diabetes mellitus (GDM) affecting fourth pregnancy on their problem list.  ----------------------------------------------------------------------------------- Patient reports no complaints.  Here today for in-person teaching on how to use blood glucose meter and supplies. Vag. Bleeding: None.  Movement: Present. No leaking of fluid.  ----------------------------------------------------------------------------------- The following portions of the patient's history were reviewed and updated as appropriate: allergies, current medications, past family history, past medical history, past social history, past surgical history and problem list. Problem list updated.   Objective  Blood pressure 100/60, weight 221 lb 12.8 oz (100.6 kg), last menstrual period 06/15/2018. Pregravid weight 207 lb (93.9 kg) Total Weight Gain 14 lb 12.8 oz (6.713 kg) Urinalysis: Urine dipstick shows negative for glucose, positive for protein (trace). Fetal Status:     Movement: Present     General:  Alert, oriented and cooperative. Patient is in no acute distress.  Skin: Skin is warm and dry. No rash noted.   Cardiovascular: Normal heart rate noted  Respiratory: Normal respiratory effort, no problems with respiration noted  Abdomen: Soft, gravid, appropriate for gestational age.       Pelvic:  Cervical exam deferred        Extremities: Normal range of motion.     Mental Status: Normal mood and affect. Normal behavior. Normal judgment and  thought content.     Assessment   [name] at [redacted]w[redacted]d, EDD 03/28/2019 by Ultrasound presenting for a prenatal visit.  Plan   Pregnancy #4 Problems (from 08/15/18 to present)    Problem Noted Resolved   Gestational diabetes mellitus (GDM) affecting fourth pregnancy 01/15/2019 by Natale Milch, MD No   Overview Signed 01/15/2019 11:17 AM by Natale Milch, MD    Current Diabetic Medications:  None  [ ]  Aspirin 81 mg daily after 12 weeks; discontinue after 36 weeks (? A2/B GDM)  Required Referrals for A1GDM or A2GDM: [ ]  Diabetes Education and Testing Supplies [ ]  Nutrition Cousult  For A2/B GDM or higher classes of DM [ ]  Diabetes Education and Testing Supplies [ ]  Nutrition Counsult [ ]  Fetal ECHO after 22-24 weeks  [ ]  Eye exam for retina evaluation  [ ]  Baseline EKG [ ]  US fetal growth every 4 weeks starting at 28 weeks [ ]  Twice weekly NST starting at [redacted] weeks gestation [ ]  Delivery planning contingent on fetal growth, AFI, glycemic control, and other co-morbidities but at least by 39 weeks  Baseline and surveillance labs (pulled in from [facility], refresh links as needed)  Lab Results  Component Value Date   CREATININE 0.51 11/04/2018   AST 22 07/30/2018   ALT 24 07/30/2018   LABPROT 12.6 11/04/2018   Lab Results  Component Value Date   HGBA1C 5.5 10/14/2018    Antenatal Testing Class of DM U/S NST/AFI DELIVERY  Diabetes   A1 - good control - O24.410    A2 - good control - O24.419      A2  - poor control or poor compliance - O24.419, E11.65   (Macrosomia or polyhydramnios) **E11.65 is extra code for poor control**    A2/B -  O24.919  and B-C O24.319  Poor control B-C or D-R-F-T - O24.319  or  Type I DM - O24.019  20-38  20-38  20-24-28-32-36   20-24-28-32-35-38//fetal echo  20-24-27-30-33-36-38//fetal echo  40  32//2 x wk  32//2 x wk   32//2 x wk  28//BPP wkly then 32//2 x wk  40  39  PRN   39  PRN           Obesity in pregnancy, antepartum 09/12/2018 by Natale MilchSchuman, Christanna R, MD No   Supervision of high-risk pregnancy 08/27/2018 by Vena AustriaStaebler, Andreas, MD No   Overview Addendum 01/22/2019  9:41 AM by Natale MilchSchuman, Christanna R, MD    [facility] Prenatal Labs  Dating 7 week US Blood type: A negative  Genetic Screen Declines Antibody: negative  Anatomic US complete Rubella: Immune Varicella: Immune  GTT Early: hgbA1C 5.5 Third trimester: ELEVATED 3hr RPR: NR  Rhogam  [x ] 28 weeks-01/06/2019 HBsAg: negative  TDaP vaccine    01/22/2019                    Flu Shot: 09/12/2018 HIV: negative  Baby Food  Breast- experienced                              GBS:   Contraception  Desires tubal, understands may not be possible with COVID-19 outbreak Conceived this pregnancy with IUD- consent signed Pap: 08/05/2018 NIL HPV positive  CBB     CS/VBAC  hx vaginal deliveries  pyelo in pregnancy  Support Person            Rh negative state in antepartum period 08/27/2018 by Vena AustriaStaebler, Andreas, MD No    Teaching done on how to check blood glucose with Spanish interpreter present. Patient demonstrated use and was able to successfully complete the steps to perform a blood glucose measurement. Questions answered. Rx sent for lancets as the ordered brand was out of stock at her pharmacy.   [facility] education with Spanish interpreter present next week. Will return to office on 6/1 for a follow up to review glucose log.  Please refer to After Visit Summary for other counseling recommendations.   Return in about 10 days (around 02/03/2019) for ROB with interpreter.  Marcelyn BruinsJacelyn Schmid, CNM 01/24/2019

## 2019-01-24 NOTE — Patient Instructions (Signed)
Diabetes mellitus gestacional, cuidados personales Gestational Diabetes Mellitus, Self Care Las mujeres que tienen diabetes gestacional (diabetes mellitus gestacional) deben mantener su nivel de azcar en la sangre (glucosa) dentro de un rango saludable. Es posible hacerlo por medio de lo siguiente:  Alimentacin.  Actividad fsica.  Cambios en el estilo de vida.  Medicamentos o insulina, si es necesario.  [employer] mdicos y de Producer, television/film/video. Si recibe tratamiento para esta afeccin, es posible que ni usted ni su beb en gestacin (feto) se vean afectados. Si no recibe tratamiento, esta afeccin puede causar problemas que pueden ser perjudiciales para usted o su beb en gestacin. Si tiene diabetes gestacional:  Es ms probable que vuelva a tenerla si queda embarazada nuevamente.  Es ms probable que desarrolle diabetes tipo2 en el futuro. Cmo mantenerse informada sobre Retail buyer de azcar en la sangre   Controle su nivel de azcar en la sangre todos los das durante el [place]. Haga los controles con la frecuencia que le hayan indicado.  Llame al mdico si el nivel de azcar en la sangre est por encima de las cifras ideales en dosanlisis seguidos. El mdico fijar objetivos de tratamiento personales para usted. Generalmente, los [employer] niveles de Location manager en la sangre deben ser los siguientes:  Antes de las comidas, o despus de no haber comido durante un tiempo prolongado (en ayunas o preprandial): igual o menor que 95 mg/dl (5,3 mmol/l).  Despus de las comidas (posprandial): ? Una hora despus de una comida: igual o menor que 144m/dl (7,868ml/l). ? Dos horas despus de una comida: igual o menor que 12061ml (6,7mm53ml).  Nivel de A1c (hemoglobinaA1c): del 6% al 6,5%. Cmo controlar los niveles altos y bajos de azcaLocation managerla sangre Signos de un nivel alto de azcar en la sangre Un nivel alto de azcar en la sangre se denomina hiperglucemia. Conozca  cules son los signos de un nivel alto de azcaDispensing opticians signos pueden incluir lo siguiente:  Sentir: ? Sed. ? Hambre. ? Mucho cansancio.  Necesidad de orinGarment/textile technologist mayor frecuencia que lo habitual.  Visin borrosa. Signos de un nivel bajo de azcar en la sangre Un nivel bajo de azcar en la sangre se denomina hipoglucemia. Este cuadro ocurre cuando el nivel de azcar en la sangre es igual o menor que 70mg2m(3,9mmol56m. Los signos pueden incluir lo siguiente:  Sentir: ? Hambre. ? Preocupacin o nervios (ansiedad). ? Sudoracin y piel hIntel Corporationnfusin. ? Mareos. ? Somnolencia. ? Ganas de vomitar (nuseas).  Tener: ? Latidos cardacos acelerados. ? Dolor de cabezaNetherlandsmbios en la visin. ? Hormigueo y falta de sensibilidad (entumecimiento) alrededor de la boca, labios o lengua. ? Movimientos espasmdicos que no puede controlar (convulsiones).  Dificultades para hacer lo siguiente: ? Moverse (coordinacin). ? Dormir. ? Desmayos. ? Molestarse con facilidad (irritabilidad). Tratamiento del nivel bajo de azcar en la sangre Para tratar un nivel bajo de azcar en la sangre, ingiera un alimento o una bebida azucarada de inmediato. Si puede pensar con claridad y tragar de manera segura, siga la regla 15/15, que consiste en lo siguiente:  Consuma 15gramos de un hidrato de carbono de accin rpida (carbohidrato). Hable con su mdico acerca de cunto debera consumir.  Algunos hidratos de carbono de accin rpida son: ? Comprimidos de azcar (pastillas de glucosa). Consuma 3o 4pastillas de glucosa. ? De 6 a 8unidades de caramelos duros. ? De 4 a 6onzas (de 120 a 150ml) 30mugo de frutas. ?  De 4 a 6onzas (de 120 a 158m) de refresco comn (no diettico). ? 1 cucharada (153m de miel o azcar.  Contrlese el nivel de azcar en la sangre 1534mtos despus de ingerir el hidrato de carbono.  Si el nivel de azcar en la sangre todava es igual o menor que  79m20m (3,9mmo15m), ingiera nuevamente 15gramos de un hidrato de carbono.  Si el nivel de azcar en la sangre no supera los 79mg/65m3,9mmol/38mdespus de 3intentos, solicite ayuda de inmediato.  Ingiera una comida o una colacin en el transcurso de 1hora despus de que el nivel de azcar en la sangre se haya normalizado. Tratamiento del nivel muy bajo de azcar en la sangre Si el nivel de azcar en la sangre es igual o menor que 54mg/dl65mmol/l)73mignifica que est muy bajo (hipoglucemia grave). Esto es una emergEngineer, maintenance (IT)re a ver si los sntomas desaparecen. Solicite atencin mdica de inmediato. Comunquese con el servicio de emergencias de su localidad (911 en los [place]). Si su nivel de azcar en la sangre es muy bajo y no puede ingerir ningn alimento ni bebida, tal vez deba aplicarse una inyeccin de glucagn. Un familiar o un amigo deben aprender a controlarle el azcar en la sangre y a aplicarle una inyeccin de glucagn. Pregntele al mdico si debe tener un kit de inyecciones de glucagn en su casa. Siga estas indicaciones en su casa: Medicamentos  Aplquese la insulina y tome los medicamentos para la diabetes como se lo hayan indicado.  Si el mdico le indica que se aplique ms o menos insulina, o que tome ms o menos medicamentos, haga exactamente lo que le diga. LandAmerica Financialquede sin insulina o sin medicamentos. Alimentos   Opte por opciones de alimentos saludables. Estos incluyen los siguientes: ? Pollo, pescado, claras de huevos y frijoles. ? Avena, harina integral, trigo burgol, arroz integral, quinua y mijo. ? Frutas y Lambert Modyas frescas. ? Productos lcteos descremados. ? Frutos secos, aguacate, aceite de oliva y aceite de canola.  Consulte a un especialista en alimentacin (nutricionista). Este profesional puede ayudarla a elaborar Paediatric nursede alimentacin adecuado para usted.  Siga las indicaciones del mdico respecto de lo que no puede comer o  beber.  Beba suficiente lquido para mantener Contractororina) de color amarillo plido.  Ingiera refrigerios saludables entre comidas nutritivas.  Lleve un registro de los hidratos de carbono que consume. Para hacerlo, lea las etiquetas de informacin nutricional y aprenda cules son los tamaos de las porciones de los alimentos.  Siga su plan para los das de enfermedad cuando no pueda comer ni beber normalmente. Elabore eUnited Auto el mdico, de modo que est listo para usarlo. Actividad  Haga actividad fsica durante 30minutos71ms por da, o durante el tiempo que el mdico le rViacome.  Hable con el mdico antes de comenzar una rutina de ejercicio o actividad nueva. Es posible que el mdico le indique que haga cambios en: ? La cantidad de insulina qDover Corporation o los medicamentos que toma. ? Cunto debe comer. Estilo de vida  No beba alcohol.  No use productos que contengan tabaco. Estos incluyen cigarrillos, tabaco para mascar y cigarrilloPsychologist, sport and exerciseita ayuda para dejar de fumar, consulte al mdico.  Aprenda cmo sobrellevar el estrs. Si necesita ayuda para lograrlo, consulte al mdico. CuiMeadWestvacodel cuerpo  Mantngase al da con las vacunas (inmunizaciones).  Cepllese los dientes y las encas Mead Valleylo dental unaArdelia Mems  o ms veces por da.  Vaya al dentista una vez cada 68mses o con ms frecuencia.  Mantenga un peso sTax adviser Indicaciones generales  TDelphide venta libre y los recetados solamente como se lo haya indicado el mdico.  Pregntele al mdico sobre los riesgos de la presin arterial alta en el embarazo (preeclampsia y eclampsia).  Comparta su plan de atencin de la diabetes con: ? Sus compaeros de trabajo o de la escuela. ? LAnadarko Petroleum Corporationcon las que cTrenton  Hgase pruebas de orina para dProduct managerpresencia de cetonas: ? Cuando est enferma. ? Como se lo haya indicado el  mdico.  Lleve consigo una tarjeta, o use un brazalete o una medalla que indique que tiene diabetes.  Concurra a todas las visitas de control como se lo haya indicado el mdico. Esto es importante. Cuidados despus del parto  Hgase controlar el nivel de azcar en la sangre 4 a 12semanas despus del parto.  Hgase controlar si tiene diabetes una o ms veces en el plazo de 3 aos. Preguntas para hacerle al mdico  Es necesario que me rena con uRadio broadcast assistanten el cuidado de la diabetes?  Dnde puedo encontrar un grupo de a14para mujeres con diabetes gestacional? Dnde buscar ms informacin Para obtener ms informacin sobre la diabetes, visite los siguientes sitios web:  American Diabetes Association (Asociacin Estadounidense de la Diabetes): www.diabetes.org  Centers for Disease Control and Prevention (Librarian, academic (Centros para eBuilding surveyory la Prevencin de EArboriculturist: whttp://www.wolf.info/Resumen  Controle su nivel de azcar en la sangre (glucosa) [place] Haga los controles con la frecuencia que le hayan indicado.  Aplquese la insulina y tome los medicamentos para la diabetes como se lo hayan indicado.  Concurra a todas las visitas de control como se lo haya indicado el mdico. Esto es importante.  Hgase controlar el nivel de azcar en la sangre 4 a 12semanas despus del parto. Esta informacin no tiene cMarine scientistel consejo del mdico. Asegrese de hacerle al mdico cualquier pregunta que tenga. Document Released: 12/13/2015 Document Revised: 04/04/2018 Document Reviewed: 04/04/2018 Elsevier Interactive Patient Education  2019 EReynolds American Diabetes mellitus gestacional, diagnstico Gestational Diabetes Mellitus, Diagnosis La diabetes gestacional (diabetes mellitus gestacional) es una forma de diabetes a corto plazo (temporal) que puede presentarse durante el eMedia planner Este cuadro desaparece despus del parto. Puede deberse a uno de eCitigroupo a ambos:  El pncreas no produce suficiente cantidad de una hormona llamada insulina.  El cuerpo no responde de forma normal a la insulina que produce. La insulina permite que los ciertos azcares (glucosa) ingresen a las clulas del cuerpo. Esto le proporciona energa. Si tiene diabetes, los azcares no pueden ingresar a las clulas. Esto produce un aumento del nivel de aDispensing optician(hiperglucemia). Si tiene diabetes gestacional:  Es ms probable que vuelva a tenerla si queda embarazada nuevamente.  Es ms probable que desarrolle diabetes tipo2 en el futuro. Si la diabetes gestacional se trata, es posible que ni usted ni el beb se vean afectados. El mdico fijar los objetivos del tratamiento para usted. En general, los resultados de los niveles de azcar en la sangre deben ser los siguientes:  Despus de no comer durante mucho tiempo (ayunar): 976mdl (5,76m41m/l).  Despus de las comidas (posprandial): ? Una hora despus de una comida: igual o menor que 140m57m (7,8mmo60m). ? Dos horas despus de una comida: igual o menor que 120mg/27m6,7mmol/46m  Nivel de A1c (hemoglobinaA1c): del 6% al 6,5%. Siga estas indicaciones en su casa: Preguntas para hacerle al mdico   Puede hacer las siguientes preguntas: ? Es necesario que consulte a Radio broadcast assistant en el cuidado de la diabetes? ? Qu equipos necesitar para cuidarme en casa? ? Qu medicamentos necesito? Cundo debo tomarlos? ? Con qu frecuencia debo controlar mi nivel de azcar en la sangre? ? A qu nmero puedo llamar si tengo preguntas? ? Cundo es mi prxima cita con el mdico? Instrucciones generales  [employer] de venta libre y los recetados solamente como se lo haya indicado el mdico.  Mantenga un peso saludable durante el [place].  Concurra a todas las visitas de seguimiento como se lo haya indicado el mdico. Esto es importante. Comunquese con un mdico si:  Su nivel  de azcar en la sangre es igual o mayor que 281m/dl (13,384ml/dl).  Su nivel de azcar en la sangre es igual o mayor que 20065ml (11,1mm41ml), y tiene cetonas en la orinMoyersa estado enferma o ha tenido fiebre durante ms de 2 das y no mejoBurrowsiene alguno de estos problemas durante ms de 6horas: ? No puede comer ni beber. ? Siente malestar estomacal (nuseas). ? Vomita. ? Presenta heces lquidas (diarrea). Solicite ayuda de inmediato si:  El nivel de azcar en la sangre est por debajo de 54mg57m(3mmol80m.  Se siente confundida.  Tiene dificultad para hacer lo siguiente: ? Pensar con claridad. ? Respirar.  El beb se mueve menos de lo normal.  Tiene alguno de estos sntomas: ? Niveles moderados o altos de cetonas en la orina. ? Sangre proveniente de la vagina. ? Secrecin de un lquido fuera de lo comn de la vagina. ? Contracciones prematuras. Estas pueden causar una sensacin de opresin en el vientre. Resumen  La diabetes gestacional es una forma de diabetes a corto plazo. Puede suceder mientras est embarazada. Este cuadro desaparece despus del parto.  Si la diabetes gestacional se trata, es posible que ni usted ni el beb se vean afectados. El mdico fijar los objetivos del tratamiento para usted.  Concurra a todas las visitas de seguimiento como se lo haya indicado el mdico. Esto es importante. Esta informacin no tiene como fMarine scientistnsejo del mdico. Asegrese de hacerle al mdico cualquier pregunta que tenga. Document Released: 12/13/2015 Document Revised: 06/19/2017 Document Reviewed: 09/24/2015 Elsevier Interactive Patient Education  2019 ElseviReynolds American

## 2019-01-24 NOTE — Progress Notes (Signed)
ROB- no concerns 

## 2019-01-30 ENCOUNTER — Other Ambulatory Visit: Payer: Self-pay

## 2019-01-30 ENCOUNTER — Encounter: Payer: BLUE CROSS/BLUE SHIELD | Attending: Obstetrics and Gynecology | Admitting: *Deleted

## 2019-01-30 ENCOUNTER — Encounter: Payer: Self-pay | Admitting: *Deleted

## 2019-01-30 VITALS — BP 110/70 | Ht 63.0 in | Wt 220.6 lb

## 2019-01-30 DIAGNOSIS — O24419 Gestational diabetes mellitus in pregnancy, unspecified control: Secondary | ICD-10-CM | POA: Diagnosis not present

## 2019-01-30 DIAGNOSIS — Z713 Dietary counseling and surveillance: Secondary | ICD-10-CM | POA: Diagnosis not present

## 2019-01-30 DIAGNOSIS — O24410 Gestational diabetes mellitus in pregnancy, diet controlled: Secondary | ICD-10-CM

## 2019-01-30 NOTE — Patient Instructions (Signed)
Read booklet on Gestational Diabetes Follow Gestational Meal Planning Guidelines Avoid sugar sweetened drinks Complete a 3 Day Food Record and bring to next appointment Check blood sugars 4 x day - before breakfast and 2 hrs after every meal and record  Bring blood sugar log to all appointments Purchase urine ketone strips if ordered by MD and check urine ketones every am:  If + increase bedtime snack to 1 protein and 2 carbohydrate servings Walk 20-30 minutes at least 5 x week if permitted by MD

## 2019-01-30 NOTE — Progress Notes (Signed)
Diabetes Self-Management Education  Visit Type: First/Initial  Appt. Start Time: 1330 Appt. End Time: 1510  01/30/2019  [name], identified by name and date of birth, is a 28 y.o. female with a diagnosis of Diabetes: Gestational Diabetes.   ASSESSMENT  Blood pressure 110/70, height 5\' 3"  (1.6 m), weight 220 lb 9.6 oz (100.1 kg), last menstrual period 06/15/2018. Body mass index is 39.08 kg/m.  Diabetes Self-Management Education - 01/30/19 1538      Visit Information   Visit Type  First/Initial      Initial Visit   Diabetes Type  Gestational Diabetes    Are you currently following a meal plan?  No    Are you taking your medications as prescribed?  Yes    Date Diagnosed  3 weeks ago      Health Coping   How would you rate your overall health?  Good      Psychosocial Assessment   Patient Belief/Attitude about Diabetes  Other (comment)   "sad and concerned"   Self-care barriers  English as a second language    Self-management support  Doctor's office;Family    Other persons present  Interpreter    Patient Concerns  Nutrition/Meal planning;Weight Control;Healthy Lifestyle    Special Needs  Other (comment)   Materials in Spanish   Preferred Learning Style  Auditory;Visual    Learning Readiness  Ready    How often do you need to have someone help you when you read instructions, pamphlets, or other written materials from your doctor or pharmacy?  1 - Never   if written in Spanish   What is the last grade level you completed in school?  7th      Pre-Education Assessment   Patient understands the diabetes disease and treatment process.  Needs Instruction    Patient understands incorporating nutritional management into lifestyle.  Needs Instruction    Patient undertands incorporating physical activity into lifestyle.  Needs Instruction    Patient understands using medications safely.  Needs Instruction    Patient understands monitoring blood glucose,  interpreting and using results  Needs Review    Patient understands prevention, detection, and treatment of acute complications.  Needs Instruction    Patient understands prevention, detection, and treatment of chronic complications.  Needs Instruction    Patient understands how to develop strategies to address psychosocial issues.  Needs Instruction    Patient understands how to develop strategies to promote health/change behavior.  Needs Instruction      Complications   Last HgB A1C per patient/outside source  5.5 %   10/14/2018   How often do you check your blood sugar?  3-4 times/day    Fasting Blood glucose range (mg/dL)  81-19170-129   FBG's [phone] mg/dL.    Postprandial Blood glucose range (mg/dL)  56-213;086-578;469-62970-129;130-179;180-200   post breakfast 92-183 mg/dL, post lunch 528-413108-139 mg/dL and post supper 244-010117-193 mg/dL.    Have you had a dilated eye exam in the past 12 months?  No    Have you had a dental exam in the past 12 months?  No    Are you checking your feet?  No      Dietary Intake   Breakfast  eggs, toast, beans    Snack (morning)  fruit (pineapple, apple, orange, mango, watermelon)    Lunch  meat, fish, chicken, tortilla, rice, salad with lettuce tomatoes, cuccumbers, lemon    Snack (afternoon)  fruit    Dinner  leftovers, pancakes, eggs, tomatoes  and chili, cheese, tortilla, fruit    Beverage(s)  water, smoothie with fruit and veggies and sugar; coffee with sugar; milk      Exercise   Exercise Type  Light (walking / raking leaves)    How many days per week to you exercise?  3    How many minutes per day do you exercise?  30    Total minutes per week of exercise  90      Patient Education   Previous Diabetes Education  No    Disease state   Definition of diabetes, type 1 and 2, and the diagnosis of diabetes;Factors that contribute to the development of diabetes    Nutrition management   Role of diet in the treatment of diabetes and the relationship between the three main  macronutrients and blood glucose level;Reviewed blood glucose goals for pre and post meals and how to evaluate the patients' food intake on their blood glucose level.    Physical activity and exercise   Role of exercise on diabetes management, blood pressure control and cardiac health.    Monitoring  Purpose and frequency of SMBG.;Taught/discussed recording of test results and interpretation of SMBG.;Ketone testing, when, how.    Chronic complications  Relationship between chronic complications and blood glucose control    Psychosocial adjustment  Identified and addressed patients feelings and concerns about diabetes    Preconception care  Pregnancy and GDM  Role of pre-pregnancy blood glucose control on the development of the fetus;Role of family planning for patients with diabetes;Reviewed with patient blood glucose goals with pregnancy      Individualized Goals (developed by patient)   Reducing Risk  Improve blood sugars Lose weight Lead a healthier lifestyle     Outcomes   Expected Outcomes  Demonstrated interest in learning. Expect positive outcomes    Future DMSE  2 wks       Individualized Plan for Diabetes Self-Management Training:   Learning Objective:  Patient will have a greater understanding of diabetes self-management. Patient education plan is to attend individual and/or group sessions per assessed needs and concerns.   Plan:   Patient Instructions  Read booklet on Gestational Diabetes Follow Gestational Meal Planning Guidelines Avoid sugar sweetened drinks Complete a 3 Day Food Record and bring to next appointment Check blood sugars 4 x day - before breakfast and 2 hrs after every meal and record  Bring blood sugar log to all appointments Purchase urine ketone strips if ordered by MD and check urine ketones every am:  If + increase bedtime snack to 1 protein and 2 carbohydrate servings Walk 20-30 minutes at least 5 x week if permitted by MD  Expected Outcomes:   Demonstrated interest in learning. Expect positive outcomes  Education material provided:  Gestational Booklet (Spanish) Gestational Meal Planning Guidelines (Spanish) Simple Meal Plan (Spanish) Viewed Gestational Diabetes Video (Spanish) 3 Day Food Record (Spanish) Goals for a Healthy Pregnancy (Spanish)  If problems or questions, patient to contact team via:  Sharion Settler, RN, CCM, CDE [phone]  Future DSME appointment: 2 wks  February 17, 2019 with the dietitian

## 2019-02-03 ENCOUNTER — Other Ambulatory Visit: Payer: Self-pay

## 2019-02-03 ENCOUNTER — Ambulatory Visit (INDEPENDENT_AMBULATORY_CARE_PROVIDER_SITE_OTHER): Payer: BLUE CROSS/BLUE SHIELD | Admitting: Certified Nurse Midwife

## 2019-02-03 VITALS — BP 98/56 | Wt 223.0 lb

## 2019-02-03 DIAGNOSIS — O24410 Gestational diabetes mellitus in pregnancy, diet controlled: Secondary | ICD-10-CM

## 2019-02-03 DIAGNOSIS — O99213 Obesity complicating pregnancy, third trimester: Secondary | ICD-10-CM

## 2019-02-03 DIAGNOSIS — O24419 Gestational diabetes mellitus in pregnancy, unspecified control: Secondary | ICD-10-CM

## 2019-02-03 DIAGNOSIS — O0993 Supervision of high risk pregnancy, unspecified, third trimester: Secondary | ICD-10-CM

## 2019-02-03 DIAGNOSIS — Z3A32 32 weeks gestation of pregnancy: Secondary | ICD-10-CM

## 2019-02-03 DIAGNOSIS — O99210 Obesity complicating pregnancy, unspecified trimester: Secondary | ICD-10-CM

## 2019-02-03 LAB — POCT URINALYSIS DIPSTICK OB
Glucose, UA: NEGATIVE
POC,PROTEIN,UA: NEGATIVE

## 2019-02-03 NOTE — Progress Notes (Signed)
No complaints

## 2019-02-04 NOTE — Progress Notes (Signed)
HROB at 32wk3days: GDMA1-saw nutritionist on 01/30/2019 and has a better understanding of GDM diet BAby active. CBG log: FBS:85-112 with downward trend since seeing at [facility] (last 3 values 90, 88, 85) 2hr PP: breakfast 92-193 (last 4 values 93, 110, 100, 129)  Lunch 81-134 (last 4 values <120)  Supper 98-152 (last 3 values <120) Applauded efforts to change diet to bring down blood glucose levels. Aware that if unable to control blood sugars with diet, may need insulin or oral agent. Received TDAP 01/22/2019 Desires to breast feed. Desires BTL/ consent signed 5/26. Explained permanence of procedure with a failure rate of 1/300. Explained risks of bleeding infection, injury to other pelvic/abdominal structures and the risks of anesthesia. Aware that BTL may be delayed and she may need to have an interval tubal as an OP. ROB in 1 week to review glucose log and for growth scan  Farrel Conners, CNM Dis

## 2019-02-10 ENCOUNTER — Encounter: Payer: BLUE CROSS/BLUE SHIELD | Admitting: Certified Nurse Midwife

## 2019-02-12 ENCOUNTER — Ambulatory Visit (INDEPENDENT_AMBULATORY_CARE_PROVIDER_SITE_OTHER): Payer: BLUE CROSS/BLUE SHIELD | Admitting: Maternal Newborn

## 2019-02-12 ENCOUNTER — Encounter: Payer: Self-pay | Admitting: Maternal Newborn

## 2019-02-12 ENCOUNTER — Other Ambulatory Visit: Payer: Self-pay

## 2019-02-12 VITALS — BP 104/68 | Wt 222.0 lb

## 2019-02-12 DIAGNOSIS — O0993 Supervision of high risk pregnancy, unspecified, third trimester: Secondary | ICD-10-CM

## 2019-02-12 DIAGNOSIS — Z3A33 33 weeks gestation of pregnancy: Secondary | ICD-10-CM

## 2019-02-12 NOTE — Progress Notes (Signed)
No vb. No lof.  

## 2019-02-12 NOTE — Progress Notes (Signed)
Routine Prenatal Care Visit  Subjective  Cheyenne Gomez is a 28 y.o. [phone] at [redacted]w[redacted]d being seen today for ongoing prenatal care.  She is currently monitored for the following issues for this high-risk pregnancy and has Calculus of gallbladder with biliary obstruction but without cholecystitis; Supervision of high-risk pregnancy; Rh negative state in antepartum period; Obesity in pregnancy, antepartum; Pyelonephritis affecting pregnancy in second trimester; and Gestational diabetes mellitus (GDM) affecting fourth pregnancy on their problem list.  ----------------------------------------------------------------------------------- Patient reports fatigue. Working 12 hour shifts and standing for most of that time. Contractions: Not present. Vag. Bleeding: None.  Movement: Present. No leaking of fluid.  ----------------------------------------------------------------------------------- The following portions of the patient's history were reviewed and updated as appropriate: allergies, current medications, past family history, past medical history, past social history, past surgical history and problem list. Problem list updated.  Objective  Blood pressure 104/68, weight 222 lb (100.7 kg), last menstrual period 06/15/2018. Pregravid weight 207 lb (93.9 kg) Total Weight Gain 15 lb (6.804 kg)  Fetal Status: Fetal Heart Rate (bpm): 144 Fundal Height: 35 cm Movement: Present     General:  Alert, oriented and cooperative. Patient is in no acute distress.  Skin: Skin is warm and dry. No rash noted.   Cardiovascular: Normal heart rate noted  Respiratory: Normal respiratory effort, no problems with respiration noted  Abdomen: Soft, gravid, appropriate for gestational age. Pain/Pressure: Absent     Pelvic:  Cervical exam deferred        Extremities: Normal range of motion.  Edema: None  Mental Status: Normal mood and affect. Normal behavior. Normal judgment and thought content.      Assessment   28 y.o. [id number] at [redacted]w[redacted]d, EDD 03/28/2019 by Ultrasound presenting for a work-in prenatal visit.  Plan   Pregnancy #4 Problems (from 08/15/18 to present)    Problem Noted Resolved   Gestational diabetes mellitus (GDM) affecting fourth pregnancy 01/15/2019 by Homero Fellers, MD No   Overview Signed 01/15/2019 11:17 AM by Homero Fellers, MD    Current Diabetic Medications:  None  [ ]  Aspirin 81 mg daily after 12 weeks; discontinue after 36 weeks (? A2/B GDM)  Required Referrals for A1GDM or A2GDM: [ ]  Diabetes Education and Testing Supplies [ ]  Nutrition Cousult  For A2/B GDM or higher classes of DM [ ]  Diabetes Education and Testing Supplies [ ]  Nutrition Counsult [ ]  Fetal ECHO after 22-24 weeks  [ ]  Eye exam for retina evaluation  [ ]  Baseline EKG [ ]  US fetal growth every 4 weeks starting at 28 weeks [ ]  Twice weekly NST starting at [redacted] weeks gestation [ ]  Delivery planning contingent on fetal growth, AFI, glycemic control, and other co-morbidities but at least by 39 weeks  Baseline and surveillance labs (pulled in from [facility], refresh links as needed)  Lab Results  Component Value Date   CREATININE 0.51 11/04/2018   AST 22 07/30/2018   ALT 24 07/30/2018   LABPROT 12.6 11/04/2018   Lab Results  Component Value Date   HGBA1C 5.5 10/14/2018    Antenatal Testing Class of DM U/S NST/AFI DELIVERY  Diabetes   A1 - good control - O24.410    A2 - good control - O24.419      A2  - poor control or poor compliance - O24.419, E11.65   (Macrosomia or polyhydramnios) **E11.65 is extra code for poor control**    A2/B - O24.919  and B-C O24.319  Poor control B-C or D-R-F-T -  O24.319  or  Type I DM - O24.019  20-38  20-38  20-24-28-32-36   20-24-28-32-35-38//fetal echo  20-24-27-30-33-36-38//fetal echo  40  32//2 x wk  32//2 x wk   32//2 x wk  28//BPP wkly then 32//2 x wk  40  39  PRN   39  PRN          Obesity in  pregnancy, antepartum 09/12/2018 by Natale MilchSchuman, Christanna R, MD No   Supervision of high-risk pregnancy 08/27/2018 by Vena AustriaStaebler, Andreas, MD No   Overview Addendum 02/04/2019  8:27 AM by Farrel ConnersGutierrez, Colleen, CNM    Clinic [facility] Prenatal Labs  Dating 7 week US Blood type: A negative  Genetic Screen Declines Antibody: negative  Anatomic US complete Rubella: Immune Varicella: Immune  GTT Early: hgbA1C 5.5 Third trimester: ELEVATED 3hr (100/221/119/102 RPR: NR  Rhogam  [x ] 28 weeks-01/06/2019 HBsAg: negative  TDaP vaccine    01/22/2019                    Flu Shot: 09/12/2018 HIV: negative  Baby Food  Breast- experienced                              GBS:   Contraception  Desires tubal, understands may not be possible with COVID-19 outbreak Conceived this pregnancy with IUD- consent signed 5/26 Pap: 08/05/2018 NIL HPV positive  CBB     CS/VBAC  hx vaginal deliveries  pyelo in pregnancy  Support Person            Rh negative state in antepartum period 08/27/2018 by Vena AustriaStaebler, Andreas, MD No    Spanish interpreter present for our visit. Discussed taking seated rest breaks at work and can write note for employer stating necessity for this as needed.  Glucose log review, fasting range 80-103, about 50% of these values were above 95. Postprandial range 93-162, about 50% of these values were above 120, about 1/3 of those were 133-142, with 2 values over 150 (153, 162). There are fewer values at the upper end of the range than last time.  She is having some difficulty adhering to dietary recommendations but acknowledges improvement in her BG values when she is able to follow the guidelines. She has another appointment with [facility] for diabetes education. We discussed complications with elevated glucose levels, as well as the need to have most values within normal limits or medication therapy will be required. Review next week with MD when additional log values presented.  Please refer to After Visit  Summary for other counseling recommendations.   Return for scheduled visit 6/16 with growth scan.  Marcelyn BruinsJacelyn Schmid, CNM 02/12/2019  4:18 PM

## 2019-02-12 NOTE — Patient Instructions (Signed)
Tercer trimestre de embarazo  Third Trimester of Pregnancy  El tercer trimestre comprende desde la semana28 hasta la semana40 (desde el mes7 hasta el mes9). El tercer trimestre es un perodo en el que el beb en gestacin (feto) crece rpidamente. Hacia el final del noveno mes, el feto mide alrededor de 20pulgadas (45cm) de largo y pesa entre 6 y 10 libras (2,700 y 4,500kg).  Cambios en el cuerpo durante el tercer trimestre  Su organismo continuar atravesando por muchos cambios durante el embarazo. Estos cambios varan de una mujer a otra. Durante el tercer trimestre:   Seguir aumentando de peso. Es de esperar que aumente entre 25 y 35libras (11 y 16kg) hacia el final del embarazo.   Podrn aparecer las primeras estras en las caderas, el abdomen y las mamas.   Puede tener necesidad de orinar con ms frecuencia porque el feto baja hacia la pelvis y ejerce presin sobre la vejiga.   Puede desarrollar o continuar teniendo acidez estomacal. Esto se debe a que el aumento de las hormonas hace que los msculos en el tubo digestivo trabajen ms lentamente.   Puede desarrollar o continuar teniendo estreimiento debido a que el aumento de las hormonas ralentiza la digestin y hace que los msculos que empujan los desechos a travs de los intestinos se relajen.   Puede desarrollar hemorroides. Estas son venas hinchadas (venas varicosas) en el recto que pueden causar picazn o dolor.   Puede desarrollar venas hinchadas y abultadas (venas varicosas) en las piernas.   Puede presentar ms dolor en la pelvis, la espalda o los muslos. Esto se debe al aumento de peso y al aumento de las hormonas que relajan las articulaciones.   Tal vez haya cambios en el cabello. Esto cambios pueden incluir su engrosamiento, crecimiento rpido y cambios en la textura. Adems, a algunas mujeres se les cae el cabello durante o despus del embarazo, o tienen el cabello seco o fino. Lo ms probable es que el cabello se le normalice  despus del nacimiento del beb.   Sus pechos seguirn creciendo y se pondrn cada vez ms sensibles. Un lquido amarillo (calostro) puede salir de sus pechos. Esta es la primera leche que usted produce para su beb.   El ombligo puede salir hacia afuera.   Puede observar que se le hinchan las manos, el rostro o los tobillos.   Puede presentar un aumento del hormigueo o entumecimiento en las manos, brazos y piernas. La piel de su vientre tambin puede sentirse entumecida.   Puede sentir que le falta el aire debido a que se expande el tero.   Puede tener ms problemas para dormir. Esto puede deberse al tamao de su vientre, una mayor necesidad de orinar y un aumento en el metabolismo de su cuerpo.   Puede notar que el feto "baja" o lo siente ms bajo, en el abdomen (aligeramiento).   Puede tener un aumento de la secrecin vaginal.   Puede notar que las articulaciones se sienten flojas y puede sentir dolor alrededor del hueso plvico.  Qu debe esperar en las visitas prenatales  Le harn exmenes prenatales cada 2semanas hasta la semana36. A partir de ese momento le harn los exmenes semanales. Durante una visita prenatal de rutina:   La pesarn para asegurarse de que usted y el beb estn creciendo normalmente.   Le tomarn la presin arterial.   Le medirn el abdomen para controlar el desarrollo del beb.   Se escucharn los latidos cardacos fetales.   Se evaluarn   los resultados de los estudios solicitados en visitas anteriores.   Le revisarn el cuello del tero cuando est prxima la fecha de parto para controlar si el cuello uterino se ha afinado o adelgazado (borrado).   Le harn una prueba de estreptococos del grupo B. Esto sucede entre las semanas 35 y 37.  El mdico puede preguntarle lo siguiente:   Cmo le gustara que fuera el parto.   Cmo se siente.   Si siente los movimientos del beb.   Si ha tenido sntomas anormales, como prdida de lquido, sangrado, dolores de cabeza  intensos o clicos abdominales.   Si est consumiendo algn producto que contenga tabaco, como cigarrillos, tabaco de mascar y cigarrillos electrnicos.   Si tiene alguna pregunta.  Otros exmenes o estudios de deteccin que pueden realizarse durante el tercer trimestre incluyen lo siguiente:   Anlisis de sangre para controlar los niveles de hierro (anemia).   Controles fetales para determinar su salud, nivel de actividad y crecimiento. Si tiene alguna enfermedad o hay problemas durante el embarazo, le harn estudios.   Prueba sin estrs. Esta prueba verifica la salud de su beb y se utiliza para detectar signos de problemas, tales como si el beb no est recibiendo suficiente oxgeno. Durante esta prueba, se coloca un cinturn alrededor de su vientre. Al moverse el beb, se controla su frecuencia cardaca.  Qu es el falso trabajo de parto?  El falso trabajo de parto es una afeccin en la que se sienten pequeos e irregulares espasmos de los msculos del tero (contracciones) que generalmente desaparecen al hacer reposo, cambiar de posicin o al beber agua. Estas contracciones se llaman contracciones de Braxton Hicks. Las contracciones pueden durar horas, das o incluso semanas, antes de que el verdadero trabajo de parto se inicie. Si las contracciones ocurren a intervalos regulares, se vuelven ms frecuentes, aumentan en intensidad o se vuelven dolorosas, debera ver al mdico.   Cules son los signos del trabajo de parto?   Clicos abdominales.   Contracciones regulares que comienzan en intervalos de 10 minutos y se vuelven ms fuertes y ms frecuentes con el tiempo.   Contracciones que comienzan en la parte superior del tero y se extienden hacia abajo, a la zona inferior del abdomen y la espalda.   Aumento de la presin en la pelvis y dolor latente en la espalda.   Una secrecin de mucosidad acuosa o con sangre que sale de la vagina.   Prdida de lquido amnitico. Esto tambin se conoce como  "ruptura de la bolsa de las aguas". Esto puede ser un chorro o un goteo constante y lento de lquido. Informe a su mdico si tiene un color u olor extrao.  Si tiene alguno de estos signos, llame a su mdico de inmediato, incluso si es antes de la fecha de parto.  Siga estas indicaciones en su casa:  Medicamentos   Siga las indicaciones del mdico en relacin con el uso de medicamentos. Durante el embarazo, hay medicamentos que pueden tomarse y otros que no.   Tome vitaminas prenatales que contengan por lo menos 600microgramos (?g) de cido flico.   Si est estreida, tome un laxante suave, si el mdico lo autoriza.  Qu debe comer y beber     Lleve una dieta equilibrada que incluya gran cantidad de frutas y verduras frescas, cereales integrales, buenas fuentes de protenas como carnes magras, huevos o tofu, y lcteos descremados. El mdico la ayudar a determinar la cantidad de peso que puede aumentar.     No coma carne cruda ni quesos sin cocinar. Estos elementos contienen grmenes que pueden causar defectos congnitos en el beb.   Si no consume muchos alimentos con calcio, hable con su mdico sobre si debera tomar un suplemento diario de calcio.   La ingesta diaria de cuatro o cinco comidas pequeas en lugar de tres comidas abundantes.   Limite el consumo de alimentos con alto contenido de grasas y azcares procesados, como alimentos fritos o dulces.   Para evitar el estreimiento:  ? Bebe suficiente lquido para mantener la orina clara o de color amarillo plido.  ? Consuma alimentos ricos en fibra, como frutas y verduras frescas, cereales integrales y frijoles.  Actividad   Haga ejercicio solamente como se lo haya indicado el mdico. La mayora de las mujeres pueden continuar su rutina de ejercicios durante el embarazo. Intente realizar como mnimo 30minutos de actividad fsica por lo menos 5das a la semana. Deje de hacer ejercicio si experimenta contracciones uterinas.   Evite levantar pesos  excesivos.   No haga ejercicio en condiciones de calor o humedad extremas, o a grandes alturas.   Use zapatos cmodos de tacn bajo.   Adopte una buena postura.   Puede seguir teniendo relaciones sexuales, excepto que el mdico le diga lo contrario.  Alivio del dolor y del malestar   Haga pausas frecuentes y descanse con las piernas elevadas si tiene calambres en las piernas o dolor en la zona lumbar.   Dese baos de asiento con agua tibia para aliviar el dolor o las molestias causadas por las hemorroides. Use una crema para las hemorroides si el mdico la autoriza.   Use un sostn que le brinde buen soporte para prevenir las molestias causadas por la sensibilidad en los pechos.   Si tiene venas varicosas:  ? Use pantimedias que brinden soporte o medias de compresin como se lo haya indicado el mdico.  ? Eleve los pies durante 15minutos, 3 o 4veces por da.  Cuidados prenatales   Escriba sus preguntas. Llvelas cuando concurra a las visitas prenatales.   Concurra a todas las visitas prenatales tal como se lo haya indicado el mdico. Esto es importante.  Seguridad   Use el cinturn de seguridad en todo momento mientras conduce.   Haga una lista de los nmeros de telfono de emergencia, que incluya los nmeros de telfono de familiares, amigos, el hospital y los departamentos de polica y bomberos.  Instrucciones generales   Evite el contacto con las bandejas sanitarias de los gatos y la tierra que estos animales usan. Estos elementos contienen grmenes que pueden causar defectos congnitos en el beb. Si tiene un gato, pdale a alguien que limpie la caja de arena por usted.   No haga viajes largos excepto que sea absolutamente necesario y solo con la autorizacin de su mdico.   No se d baos de inmersin en agua caliente, baos turcos ni saunas.   No beber alcohol.   No consuma ningn producto que contenga nicotina o tabaco, como cigarrillos y cigarrillos electrnicos. Si necesita ayuda para  dejar de fumar, consulte al mdico.   No use hierbas medicinales ni medicamentos que no le hayan recetado. Estas sustancias qumicas afectan la formacin y el desarrollo del beb.   No se haga duchas vaginales ni use tampones o toallas higinicas perfumadas.   No mantenga las piernas cruzadas durante largos periodos de tiempo.   Para prepararse para la llegada de su beb:  ? Tome clases prenatales para entender, practicar, y hacer   preguntas sobre el trabajo de parto y el parto.  ? Haga un ensayo de la partida al hospital.  ? Visite el hospital y recorra el rea de maternidad.  ? Pida un permiso de maternidad o paternidad a sus empleadores.  ? Organice para que algn familiar o amigo cuide a sus mascotas mientras usted est en el hospital.  ? Compre un asiento de seguridad orientado hacia atrs, y asegrese de saber cmo instalarlo en su automvil.  ? Prepare el bolso que llevar al hospital.  ? Prepare la habitacin del beb. Asegrese de quitar todas las almohadas y animales de peluche de la cuna del beb para evitar la asfixia.   Visite a su dentista si no lo ha hecho durante el embarazo. Use un cepillo de dientes blando para higienizarse los dientes y psese el hilo dental con suavidad.  Comunquese con un mdico si:   No est segura de que est en trabajo de parto o de que ha roto la bolsa de las aguas.   Se siente mareada.   Siente clicos leves, presin en la pelvis o dolor persistente en el abdomen.   Siente dolor en la parte inferior de la espalda.   Tiene nuseas, vmitos o diarrea persistentes.   Observa una secrecin vaginal inusual o con mal olor.   Siente dolor al orinar.  Solicite ayuda de inmediato si:   Rompe la bolsa de las aguas antes de la semana 37.   Tiene contracciones regulares en intervalos de menos de 5 minutos antes de la semana 37.   Tiene fiebre.   Tiene una prdida de lquido por la vagina.   Tiene sangrado o pequeas prdidas vaginales.   Tiene dolor o clicos  abdominales intensos.   Baja de peso o sube de peso rpidamente.   Tiene dificultad para respirar y siente dolor de pecho.   Sbitamente se le hinchan mucho el rostro, las manos, los tobillos, los pies o las piernas.   Su beb se mueve menos de 10 veces en 2 horas.   Siente un dolor de cabeza intenso que no se alivia al tomar medicamentos.   Nota cambios en la visin.  Resumen   El tercer trimestre comprende desde la semana28 hasta la semana40, es decir, desde el mes7 hasta el mes9. El tercer trimestre es un perodo en el que el beb en gestacin (feto) crece rpidamente.   Durante el tercer trimestre, su incomodidad puede aumentar a medida que usted y su beb continan aumentando de peso. Es posible que tenga dolor abdominal, en las piernas y en la espalda, problemas para dormir y una mayor necesidad de orinar.   Durante el tercer trimestre, sus pechos seguirn creciendo y se pondrn cada vez ms sensibles. Un lquido amarillo (calostro) puede salir de sus pechos. Esta es la primera leche que usted produce para su beb.   El falso trabajo de parto es una afeccin en la que se sienten pequeos e irregulares espasmos de los msculos del tero (contracciones) que a la larga desaparecen. Estas contracciones se llaman contracciones de Braxton Hicks. Las contracciones pueden durar horas, das o incluso semanas, antes de que el verdadero trabajo de parto se inicie.   Los signos del trabajo de parto pueden incluir: calambres abdominales; contracciones regulares que comienzan en intervalos de 10 minutos y se vuelven ms fuertes y ms frecuentes con el tiempo; una secrecin de mucosidad acuosa o con sangre que sale de la vagina; aumento de la presin en la pelvis y dolor   latente en la espalda; y prdida de lquido amnitico.  Esta informacin no tiene como fin reemplazar el consejo del mdico. Asegrese de hacerle al mdico cualquier pregunta que tenga.  Document Released: 05/31/2005 Document Revised:  01/02/2017 Document Reviewed: 01/02/2017  Elsevier Interactive Patient Education  2019 [employer]

## 2019-02-17 ENCOUNTER — Other Ambulatory Visit: Payer: Self-pay

## 2019-02-17 ENCOUNTER — Encounter: Payer: Self-pay | Admitting: Dietician

## 2019-02-17 ENCOUNTER — Encounter: Payer: Medicaid Other | Attending: Obstetrics and Gynecology | Admitting: Dietician

## 2019-02-17 DIAGNOSIS — O24419 Gestational diabetes mellitus in pregnancy, unspecified control: Secondary | ICD-10-CM | POA: Insufficient documentation

## 2019-02-17 DIAGNOSIS — Z713 Dietary counseling and surveillance: Secondary | ICD-10-CM | POA: Diagnosis not present

## 2019-02-17 NOTE — Progress Notes (Signed)
   Patient's BG record indicates BGs are sometimes above goal, with fasting BGs ranging 80-108, and post-meal BGs ranging 81-152. She states she initially thought that she was to keep BGs at goals levels or higher, but now understands BG goals and has been working to get BGs in goal range.   Patient's food recall indicates some meals lacking carbohydrate sources; she also reports frequent cravings for sweets after supper but does notice higher fasting BGs when she eats sweets at night. She has worked to reduce portions of evening snacks.    Provided 1800kcal meal plan, and wrote individualized menus based on patient's food preferences. Advised inclusion of small amounts of carbohydrate with meals, and inclusion of protein along with small carb portions for snacks. Discussed meal and snack examples.  Instructed patient on food safety, including avoidance of Listeriosis, and limiting mercury from fish.  Discussed importance of maintaining healthy lifestyle habits to reduce risk of Type 2 DM as well as Gestational DM with any future pregnancies.  Advised patient to use any remaining testing supplies to test some BGs after delivery, and to have BG tested ideally annually, as well as prior to attempting future pregnancies.

## 2019-02-18 ENCOUNTER — Encounter: Payer: Self-pay | Admitting: Maternal Newborn

## 2019-02-18 ENCOUNTER — Ambulatory Visit (INDEPENDENT_AMBULATORY_CARE_PROVIDER_SITE_OTHER): Payer: Medicaid Other

## 2019-02-18 ENCOUNTER — Ambulatory Visit: Payer: BLUE CROSS/BLUE SHIELD | Admitting: Maternal Newborn

## 2019-02-18 VITALS — BP 100/70 | Wt 223.0 lb

## 2019-02-18 DIAGNOSIS — O99210 Obesity complicating pregnancy, unspecified trimester: Secondary | ICD-10-CM

## 2019-02-18 DIAGNOSIS — Z362 Encounter for other antenatal screening follow-up: Secondary | ICD-10-CM

## 2019-02-18 DIAGNOSIS — O0993 Supervision of high risk pregnancy, unspecified, third trimester: Secondary | ICD-10-CM

## 2019-02-18 DIAGNOSIS — O24419 Gestational diabetes mellitus in pregnancy, unspecified control: Secondary | ICD-10-CM

## 2019-02-18 DIAGNOSIS — Z369 Encounter for antenatal screening, unspecified: Secondary | ICD-10-CM

## 2019-02-18 MED ORDER — METFORMIN HCL 500 MG PO TABS: 500.0000 mg | ORAL_TABLET | Freq: Two times a day (BID) | ORAL | 3 refills | Status: DC

## 2019-02-18 NOTE — Progress Notes (Signed)
Routine Prenatal Care Visit  Subjective  Cheyenne Gomez is a 28 y.o. [phone] at 83w4dbeing seen today for ongoing prenatal care.  She is currently monitored for the following issues for this high-risk pregnancy and has Calculus of gallbladder with biliary obstruction but without cholecystitis; Supervision of high-risk pregnancy; Rh negative state in antepartum period; Obesity in pregnancy, antepartum; Pyelonephritis affecting pregnancy in second trimester; and Gestational diabetes mellitus (GDM) affecting fourth pregnancy on their problem list.  ----------------------------------------------------------------------------------- Patient reports no complaints. Met with diabetes educator again who was satisfied with her knowledge level and stated that she did not need further appointments. Contractions: Not present. Vag. Bleeding: None.  Movement: Present. No leaking of fluid.  ----------------------------------------------------------------------------------- The following portions of the patient's history were reviewed and updated as appropriate: allergies, current medications, past family history, past medical history, past social history, past surgical history and problem list. Problem list updated.   Objective  Blood pressure 100/70, weight 223 lb (101.2 kg), last menstrual period 06/15/2018. Pregravid weight 207 lb (93.9 kg) Total Weight Gain 16 lb (7.258 kg)  Fetal Status: Fetal Heart Rate (bpm): 137   Movement: Present     General:  Alert, oriented and cooperative. Patient is in no acute distress.  Skin: Skin is warm and dry. No rash noted.   Cardiovascular: Normal heart rate noted  Respiratory: Normal respiratory effort, no problems with respiration noted  Abdomen: Soft, gravid, appropriate for gestational age. Pain/Pressure: Absent     Pelvic:  Cervical exam deferred        Extremities: Normal range of motion.  Edema: None  Mental Status: Normal mood and affect. Normal  behavior. Normal judgment and thought content.     Assessment   28y.o. [id number] 322w4dEDD 03/28/2019 by Ultrasound presenting for a routine prenatal visit.  Plan   Pregnancy #4 Problems (from 08/15/18 to present)    Problem Noted Resolved   Gestational diabetes mellitus (GDM) affecting fourth pregnancy 01/15/2019 by ScHomero FellersMD No   Overview Signed 01/15/2019 11:17 AM by ScHomero FellersMD    Current Diabetic Medications:  None  [ ]  Aspirin 81 mg daily after 12 weeks; discontinue after 36 weeks (? A2/B GDM)  Required Referrals for A1GDM or A2GDM: [ ]  Diabetes Education and Testing Supplies [ ]  Nutrition Cousult  For A2/B GDM or higher classes of DM [ ]  Diabetes Education and Testing Supplies [ ]  Nutrition Counsult [ ]  Fetal ECHO after 22-24 weeks  [ ]  Eye exam for retina evaluation  [ ]  Baseline EKG [ ]  [place] growth every 4 weeks starting at 28 weeks [ ]  Twice weekly NST starting at [redacted] weeks gestation [ ]  Delivery planning contingent on fetal growth, AFI, glycemic control, and other co-morbidities but at least by 39 weeks  Baseline and surveillance labs ([facility] needed)  Lab Results  Component Value Date   CREATININE 0.51 11/04/2018   AST 22 07/30/2018   ALT 24 07/30/2018   LABPROT 12.6 11/04/2018   Lab Results  Component Value Date   HGBA1C 5.5 10/14/2018    Antenatal Testing Class of DM U/S NST/AFI DELIVERY  Diabetes   A1 - good control - O24.410    A2 - good control - O24.419      A2  - poor control or poor compliance - O24.419, E11.65   (Macrosomia or polyhydramnios) **E11.65 is extra code for poor control**    A2/B - O24.919  and B-C O24.319  Poor control B-C or D-R-F-T - O24.319  or  Type I DM - O24.019  20-38  20-38  20-24-28-32-36   20-24-28-32-35-38//fetal echo  20-24-27-30-33-36-38//fetal echo  40  32//2 x wk  32//2 x wk   32//2 x wk  28//BPP wkly then 32//2 x wk  40  39   PRN   39  PRN          Obesity in pregnancy, antepartum 09/12/2018 by Homero Fellers, MD No   Supervision of high-risk pregnancy 08/27/2018 by Malachy Mood, MD No   Overview Addendum 02/04/2019  8:27 AM by Dalia Heading, [place] Prenatal Labs  Dating 7 week [place] Blood type: A negative  Genetic Screen Declines Antibody: negative  Anatomic [place] complete Rubella: Immune Varicella: Immune  GTT Early: hgbA1C 5.5 Third trimester: ELEVATED 3hr (100/221/119/102 RPR: NR  Rhogam  [x ] 28 weeks-01/06/2019 HBsAg: negative  TDaP vaccine    01/22/2019                    Flu Shot: 09/12/2018 HIV: negative  Baby Food  Breast- experienced                              GBS:   Contraception  Desires tubal, understands may not be possible with COVID-19 outbreak Conceived this pregnancy with IUD- consent signed 5/26 Pap: 08/05/2018 NIL HPV positive  CBB     CS/VBAC  hx vaginal deliveries  pyelo in pregnancy  Support Person            Rh negative state in antepartum period 08/27/2018 by Malachy Mood, MD No    Visit completed with assistance of Spanish interpreter.  Growth scan today showed growth percentile at 66.3%, EFW 6 lb, 1 oz, AFI 02.5 cm, cephalic presentation. Fetal bladder was noted to be enlarged today at 6.6 cm; baby did not empty bladder during scan. MD reviewed and advised a follow up ultrasound next week to check bladder measurement. Results and plan discussed with patient.  Blood glucose log shows >50% of values are elevated. Fasting value range 80-103, with only 1 abnormal value. Postprandial value range 88-172, 15/27 values are elevated. Reviewed with MD. Plan is to start Metformin 500 mg BID and make appointment for close follow up in one week with MD for log review. Explained to patient and Rx sent to pharmacy. Advised her to call if any difficulty with receiving medication.  Return in about 1 week (around 02/25/2019) for HROB/MD only/ultrasound/needs  interpreter.  Avel Sensor, CNM 02/18/2019  4:29 PM

## 2019-02-21 ENCOUNTER — Telehealth: Payer: Self-pay

## 2019-02-21 NOTE — Telephone Encounter (Signed)
FMLA/DISABILITY form for [employer] filled out, signature obtained and given to KT for processing.

## 2019-02-26 ENCOUNTER — Ambulatory Visit (INDEPENDENT_AMBULATORY_CARE_PROVIDER_SITE_OTHER): Payer: Medicaid Other

## 2019-02-26 ENCOUNTER — Other Ambulatory Visit: Payer: Self-pay

## 2019-02-26 ENCOUNTER — Ambulatory Visit (INDEPENDENT_AMBULATORY_CARE_PROVIDER_SITE_OTHER): Payer: Medicaid Other | Admitting: Obstetrics and Gynecology

## 2019-02-26 ENCOUNTER — Encounter: Payer: Self-pay | Admitting: Obstetrics and Gynecology

## 2019-02-26 ENCOUNTER — Other Ambulatory Visit (HOSPITAL_COMMUNITY)
Admission: RE | Admit: 2019-02-26 | Discharge: 2019-02-26 | Disposition: A | Discharge status: Home / Self Care | Payer: BC Managed Care – PPO | Source: Ambulatory Visit | Attending: Obstetrics and Gynecology | Admitting: Obstetrics and Gynecology

## 2019-02-26 VITALS — BP 114/70 | Wt 222.0 lb

## 2019-02-26 DIAGNOSIS — O0993 Supervision of high risk pregnancy, unspecified, third trimester: Secondary | ICD-10-CM

## 2019-02-26 DIAGNOSIS — Z3A35 35 weeks gestation of pregnancy: Secondary | ICD-10-CM | POA: Diagnosis not present

## 2019-02-26 DIAGNOSIS — O358XX0 Maternal care for other (suspected) fetal abnormality and damage, not applicable or unspecified: Secondary | ICD-10-CM

## 2019-02-26 DIAGNOSIS — Z369 Encounter for antenatal screening, unspecified: Secondary | ICD-10-CM

## 2019-02-26 DIAGNOSIS — Z362 Encounter for other antenatal screening follow-up: Secondary | ICD-10-CM

## 2019-02-26 DIAGNOSIS — O35EXX0 Maternal care for other (suspected) fetal abnormality and damage, fetal genitourinary anomalies, not applicable or unspecified: Secondary | ICD-10-CM

## 2019-02-26 DIAGNOSIS — O24419 Gestational diabetes mellitus in pregnancy, unspecified control: Secondary | ICD-10-CM

## 2019-02-26 DIAGNOSIS — O24410 Gestational diabetes mellitus in pregnancy, diet controlled: Secondary | ICD-10-CM

## 2019-02-26 LAB — POCT URINALYSIS DIPSTICK OB
Glucose, UA: NEGATIVE
POC,PROTEIN,UA: NEGATIVE

## 2019-02-26 MED ORDER — GLYBURIDE 2.5 MG PO TABS: 2.5000 mg | ORAL_TABLET | Freq: Two times a day (BID) | ORAL | 1 refills | Status: DC

## 2019-02-26 NOTE — Progress Notes (Signed)
Routine Prenatal Care Visit  Subjective  Cheyenne Gomez is a 28 y.o. [phone] at 6155w6d being seen today for ongoing prenatal care.  She is currently monitored for the following issues for this high-risk pregnancy and has Calculus of gallbladder with biliary obstruction but without cholecystitis; Supervision of high-risk pregnancy; Rh negative state in antepartum period; Obesity in pregnancy, antepartum; Pyelonephritis affecting pregnancy in second trimester; and Gestational diabetes mellitus (GDM) affecting fourth pregnancy on their problem list.  ----------------------------------------------------------------------------------- Patient reports no complaints.   Contractions: Not present. Vag. Bleeding: None.  Movement: Present. Denies leaking of fluid.  ----------------------------------------------------------------------------------- The following portions of the patient's history were reviewed and updated as appropriate: allergies, current medications, past family history, past medical history, past social history, past surgical history and problem list. Problem list updated.   Objective  Blood pressure 114/70, weight 222 lb (100.7 kg), last menstrual period 06/15/2018. Pregravid weight 207 lb (93.9 kg) Total Weight Gain 15 lb (6.804 kg) Urinalysis:      Fetal Status: Fetal Heart Rate (bpm): 140   Movement: Present  Presentation: Vertex  General:  Alert, oriented and cooperative. Patient is in no acute distress.  Skin: Skin is warm and dry. No rash noted.   Cardiovascular: Normal heart rate noted  Respiratory: Normal respiratory effort, no problems with respiration noted  Abdomen: Soft, gravid, appropriate for gestational age. Pain/Pressure: Absent     Pelvic:  Cervical exam deferred Dilation: 3 Effacement (%): 50 Station: -3  Extremities: Normal range of motion.  Edema: None  Mental Status: Normal mood and affect. Normal behavior. Normal judgment and thought content.    NST: 140 bpm baseline,  moderate variability, 15x15 accelerations, no decelerations.  Assessment   28 y.o. [id number] at 6855w6d by  03/28/2019, by Ultrasound presenting for routine prenatal visit  Plan   Pregnancy #4 Problems (from 08/15/18 to present)    Problem Noted Resolved   Gestational diabetes mellitus (GDM) affecting fourth pregnancy 01/15/2019 by Natale MilchSchuman, Prabhleen Montemayor R, MD No   Overview Signed 01/15/2019 11:17 AM by Natale MilchSchuman, Kathryn Linarez R, MD    Current Diabetic Medications:  None  [ ]  Aspirin 81 mg daily after 12 weeks; discontinue after 36 weeks (? A2/B GDM)  Required Referrals for A1GDM or A2GDM: [ ]  Diabetes Education and Testing Supplies [ ]  Nutrition Cousult  For A2/B GDM or higher classes of DM [ ]  Diabetes Education and Testing Supplies [ ]  Nutrition Counsult [ ]  Fetal ECHO after 22-24 weeks  [ ]  Eye exam for retina evaluation  [ ]  Baseline EKG [ ]  US fetal growth every 4 weeks starting at 28 weeks [ ]  Twice weekly NST starting at [redacted] weeks gestation [ ]  Delivery planning contingent on fetal growth, AFI, glycemic control, and other co-morbidities but at least by 39 weeks  Baseline and surveillance labs [facility], refresh links as needed)  Lab Results  Component Value Date   CREATININE 0.51 11/04/2018   AST 22 07/30/2018   ALT 24 07/30/2018   LABPROT 12.6 11/04/2018   Lab Results  Component Value Date   HGBA1C 5.5 10/14/2018    Antenatal Testing Class of DM U/S NST/AFI DELIVERY  Diabetes   A1 - good control - O24.410    A2 - good control - O24.419      A2  - poor control or poor compliance - O24.419, E11.65   (Macrosomia or polyhydramnios) **E11.65 is extra code for poor control**    A2/B - O24.919  and B-C  O24.319  Poor control B-C or D-R-F-T - O24.319  or  Type I DM - O24.019  20-38  20-38  20-24-28-32-36   20-24-28-32-35-38//fetal echo  20-24-27-30-33-36-38//fetal echo  40  32//2 x wk  32//2 x wk   32//2 x wk  28//BPP wkly  then 32//2 x wk  40  39  PRN   39  PRN          Obesity in pregnancy, antepartum 09/12/2018 by Homero Fellers, MD No   Supervision of high-risk pregnancy 08/27/2018 by Malachy Mood, MD No   Overview Addendum 02/04/2019  8:27 AM by Dalia Heading, [place] Prenatal Labs  Dating 7 week [place] Blood type: A negative  Genetic Screen Declines Antibody: negative  Anatomic [place] complete Rubella: Immune Varicella: Immune  GTT Early: hgbA1C 5.5 Third trimester: ELEVATED 3hr (100/221/119/102 RPR: NR  Rhogam  [x ] 28 weeks-01/06/2019 HBsAg: negative  TDaP vaccine    01/22/2019                    Flu Shot: 09/12/2018 HIV: negative  Baby Food  Breast- experienced                              GBS:   Contraception  Desires tubal, understands may not be possible with COVID-19 outbreak Conceived this pregnancy with IUD- consent signed 5/26 Pap: 08/05/2018 NIL HPV positive  CBB     CS/VBAC  hx vaginal deliveries  pyelo in pregnancy  Support Person            Rh negative state in antepartum period 08/27/2018 by Malachy Mood, MD No       Gestational age appropriate obstetric precautions including but not limited to vaginal bleeding, contractions, leaking of fluid and fetal movement were reviewed in detail with the patient.    Will refer to MFM for enlarged fetal bladder. Glucose log reviewed. Improved control. 11/36 values elevated over the last week.  Values range from 85-136 Metformin is causing significant nausea for patient. Will transition to glyburide. Will start twice a week NSTs next week.   GBS/ GC/ CT collected today.  Return in about 5 days (around 03/03/2019) for HROB/ NST [place] twice a week until delivery 39 weeks.  Adrian Prows MD [facility], [facility] 02/27/2019 8:08 AM

## 2019-02-26 NOTE — Progress Notes (Signed)
ROB/US C/o having issues with metformin causing nausea, headaches and vomiting  Denies lof, No vb, Good FM

## 2019-02-27 ENCOUNTER — Other Ambulatory Visit: Payer: Self-pay | Admitting: Obstetrics and Gynecology

## 2019-02-27 DIAGNOSIS — Z3689 Encounter for other specified antenatal screening: Secondary | ICD-10-CM

## 2019-02-27 LAB — OB RESULTS CONSOLE GBS: GBS: NEGATIVE

## 2019-02-27 LAB — OB RESULTS CONSOLE GC/CHLAMYDIA: Gonorrhea: NEGATIVE

## 2019-02-28 LAB — CERVICOVAGINAL ANCILLARY ONLY
Chlamydia: NEGATIVE
Neisseria Gonorrhea: NEGATIVE

## 2019-03-03 ENCOUNTER — Other Ambulatory Visit: Payer: Medicaid Other

## 2019-03-03 ENCOUNTER — Ambulatory Visit
Admission: RE | Admit: 2019-03-03 | Discharge: 2019-03-03 | Disposition: A | Discharge status: Home / Self Care | Payer: BC Managed Care – PPO | Source: Ambulatory Visit | Attending: Obstetrics and Gynecology | Admitting: Obstetrics and Gynecology

## 2019-03-03 ENCOUNTER — Other Ambulatory Visit: Payer: Self-pay

## 2019-03-03 ENCOUNTER — Encounter: Payer: Medicaid Other | Admitting: Maternal Newborn

## 2019-03-03 VITALS — BP 110/68 | HR 100 | Temp 98.4°F | Resp 20 | Ht 63.0 in | Wt 225.5 lb

## 2019-03-03 DIAGNOSIS — O24419 Gestational diabetes mellitus in pregnancy, unspecified control: Secondary | ICD-10-CM

## 2019-03-03 DIAGNOSIS — Z6791 Unspecified blood type, Rh negative: Secondary | ICD-10-CM

## 2019-03-03 DIAGNOSIS — O99210 Obesity complicating pregnancy, unspecified trimester: Secondary | ICD-10-CM | POA: Diagnosis present

## 2019-03-03 DIAGNOSIS — K8021 Calculus of gallbladder without cholecystitis with obstruction: Secondary | ICD-10-CM | POA: Diagnosis present

## 2019-03-03 DIAGNOSIS — Z3689 Encounter for other specified antenatal screening: Secondary | ICD-10-CM | POA: Diagnosis not present

## 2019-03-03 DIAGNOSIS — Z3A36 36 weeks gestation of pregnancy: Secondary | ICD-10-CM | POA: Insufficient documentation

## 2019-03-03 DIAGNOSIS — O24415 Gestational diabetes mellitus in pregnancy, controlled by oral hypoglycemic drugs: Secondary | ICD-10-CM

## 2019-03-03 DIAGNOSIS — O0990 Supervision of high risk pregnancy, unspecified, unspecified trimester: Secondary | ICD-10-CM

## 2019-03-03 DIAGNOSIS — O26899 Other specified pregnancy related conditions, unspecified trimester: Secondary | ICD-10-CM

## 2019-03-03 DIAGNOSIS — Z7984 Long term (current) use of oral hypoglycemic drugs: Secondary | ICD-10-CM | POA: Diagnosis not present

## 2019-03-03 DIAGNOSIS — O0993 Supervision of high risk pregnancy, unspecified, third trimester: Secondary | ICD-10-CM

## 2019-03-03 LAB — GLUCOSE, CAPILLARY: Glucose-Capillary: 146 mg/dL — ABNORMAL HIGH (ref 70–99)

## 2019-03-03 LAB — CULTURE, BETA STREP (GROUP B ONLY): Strep Gp B Culture: NEGATIVE

## 2019-03-03 NOTE — Progress Notes (Signed)
[place] Consultation   Chief Complaint: enlarged fetal  bladder on scan ? A2 GDM on glyburide   HPI: [name] is a 28 y.o. [id number] at [redacted]w[redacted]d [facility]  Additionally the patient has been followed for gestational diabetes.  She has a glucometer and is undergone diet teaching.  She tried metformin which gave her abdominal issues and is now on glyburide twice daily. The patient brought her book to clinic today Fastings are 80-1 03 (4/7 over 95) 2-hour postprandial breakfast 96-1 36 (3/7 over 120) 2-hour postprandial lunch 88-1 62 (3/7 over 120) 2-hour postprandial supper 10 9-1 53 ( 5/7 over 120) Past Medical History: Patient  has a past medical history of Calculus of gallbladder with biliary obstruction but without cholecystitis and Gestational diabetes.  Past Surgical History: She  has no past surgical history on file.  Obstetric History:  OB History    Gravida  5   Para  4   Term  3   Preterm  1   AB      Living  3     SAB      TAB      Ectopic      Multiple      Live Births  1          Gynecologic History:  Patient's last menstrual period was 06/15/2018.    Medications:  Current Outpatient Medications:  .  Blood Glucose Monitoring Suppl (ACCU-CHEK GUIDE ME) w/Device KIT, 1 each by Does not apply route 4 (four) times daily., Disp: 1 kit, Rfl: 0 .  glucose blood (ACCU-CHEK GUIDE) test strip, Use as instructed, Disp: 100 each, Rfl: 12 .  glyBURIDE (DIABETA) 2.5 MG tablet, Take 1 tablet (2.5 mg total) by mouth 2 (two) times daily with a meal., Disp: 60 tablet, Rfl: 1 .  Lancets (ACCU-CHEK SOFT TOUCH) lancets, Use as instructed, Disp: 100 each, Rfl: 12 .  nitrofurantoin, macrocrystal-monohydrate, (MACROBID) 100 MG capsule, Take 1 capsule (100 mg total) by mouth daily., Disp: 30 capsule, Rfl: 5 .  Prenatal Vit-Fe Fumarate-FA (MULTIVITAMIN-PRENATAL) 27-0.8 MG  TABS tablet, Take 1 tablet by mouth daily at 12 noon., Disp: , Rfl:  Allergies: Patient has No Known Allergies.  Social History: Patient  reports that she has never smoked. She has never used smokeless tobacco. She reports that she does not drink alcohol or use drugs.  Family History: family history includes Diabetes in her maternal grandfather.  Review of Systems A full 12 point review of systems was negative or as noted in the History of Present Illness.  Physical Exam: LMP 06/15/2018  See ultrasound Fetus at 92nd percentile Normal-appearing bladder kidneys and normal amniotic fluid volume Initial fetal heart rate 192 repeat 153 RBS 146   Asessement: No diagnosis found.  Plan: The interpreter had to leave which limited our discussion Consider pushing  glyburide up to 5 twice daily  Given macrosomia and elevated random blood sugar I suggest twice-weekly testing with nonstress test and weekly AFI and consideration of delivery between 37 and 39 weeks depending on clinical glucose control. I ordered a hemoglobin A1c today.    Total time spent with the patient was 30 minutes with greater than 50% spent in counseling and coordination of care. We appreciate this interesting consult and will be happy to be involved in the ongoing care of [name] anyway her obstetricians desire.  [place]  [facility]

## 2019-03-03 NOTE — Progress Notes (Signed)
[place] Consultation   Chief Complaint: enlarged fetal  bladder on scan ? A2 GDM on glyburide   HPI: [name] is a 28 y.o. [id number] at [redacted]w[redacted]d [facility].  Additionally the patient has been followed for gestational diabetes.  She has a glucometer and is undergone diet teaching.  She tried metformin which gave her abdominal issues and is now on glyburide twice daily. The patient brought her book to clinic today Fastings are 80-1 03 (4/7 over 95) 2-hour postprandial breakfast 96-1 36 (3/7 over 120) 2-hour postprandial lunch 88-1 62 (3/7 over 120) 2-hour postprandial supper 10 9-1 53 ( 5/7 over 120) Past Medical History: Patient  has a past medical history of Calculus of gallbladder with biliary obstruction but without cholecystitis and Gestational diabetes.  Past Surgical History: She  has no past surgical history on file.  Obstetric History:  OB History    Gravida  5   Para  4   Term  3   Preterm  1   AB      Living  3     SAB      TAB      Ectopic      Multiple      Live Births  1          Gynecologic History:  Patient's last menstrual period was 06/15/2018.    Medications:  Current Outpatient Medications:  .  glyBURIDE (DIABETA) 2.5 MG tablet, Take 1 tablet (2.5 mg total) by mouth 2 (two) times daily with a meal., Disp: 60 tablet, Rfl: 1 .  nitrofurantoin, macrocrystal-monohydrate, (MACROBID) 100 MG capsule, Take 1 capsule (100 mg total) by mouth daily., Disp: 30 capsule, Rfl: 5 .  Prenatal Vit-Fe Fumarate-FA (MULTIVITAMIN-PRENATAL) 27-0.8 MG TABS tablet, Take 1 tablet by mouth daily at 12 noon., Disp: , Rfl:  .  Blood Glucose Monitoring Suppl (ACCU-CHEK GUIDE ME) w/Device KIT, 1 each by Does not apply route 4 (four) times daily., Disp: 1 kit, Rfl: 0 .  glucose blood (ACCU-CHEK GUIDE) test strip, Use as instructed, Disp: 100 each, Rfl: 12 .  Lancets  (ACCU-CHEK SOFT TOUCH) lancets, Use as instructed, Disp: 100 each, Rfl: 12 Allergies: Patient has No Known Allergies.  Social History: Patient  reports that she has never smoked. She has never used smokeless tobacco. She reports that she does not drink alcohol or use drugs.  Family History: family history includes Diabetes in her maternal grandfather.  Review of Systems A full 12 point review of systems was negative or as noted in the History of Present Illness.  Physical Exam: BP 110/68 (BP Location: Right Arm)   Pulse 100   Temp 98.4 F (36.9 C) (Axillary)   Resp 20   Ht 5' 3" (1.6 m)   Wt 102.3 kg   LMP 06/15/2018   SpO2 95%   BMI 39.95 kg/m  See ultrasound Fetus at 92nd percentile Normal-appearing bladder kidneys and normal amniotic fluid volume Initial fetal heart rate 192 repeat 153 RBS 146   Asessement: 1. Gestational diabetes mellitus (GDM) affecting fourth pregnancy   2. Encounter for fetal anatomic survey   3. Macrosomia     Plan: The interpreter had to leave which limited our discussion Consider pushing  glyburide up to 5 twice daily  Given macrosomia and elevated random blood sugar I suggest twice-weekly testing with nonstress test and weekly AFI and consideration of delivery between 37 and 39  weeks depending on clinical glucose control. I ordered a hemoglobin A1c today.    Total time spent with the patient was 30 minutes with greater than 50% spent in counseling and coordination of care. We appreciate this interesting consult and will be happy to be involved in the ongoing care of [name] in anyway her obstetricians desire.  [place], [facility]

## 2019-03-04 LAB — HEMOGLOBIN A1C
Hgb A1c MFr Bld: 5.8 % — ABNORMAL HIGH (ref 4.8–5.6)
Mean Plasma Glucose: 120 mg/dL

## 2019-03-06 ENCOUNTER — Ambulatory Visit (INDEPENDENT_AMBULATORY_CARE_PROVIDER_SITE_OTHER): Payer: BC Managed Care – PPO | Admitting: Advanced Practice Midwife

## 2019-03-06 ENCOUNTER — Other Ambulatory Visit: Payer: Self-pay

## 2019-03-06 ENCOUNTER — Encounter: Payer: Self-pay | Admitting: Advanced Practice Midwife

## 2019-03-06 ENCOUNTER — Encounter: Payer: Medicaid Other | Admitting: Maternal Newborn

## 2019-03-06 VITALS — BP 118/68 | Wt 224.0 lb

## 2019-03-06 DIAGNOSIS — O24410 Gestational diabetes mellitus in pregnancy, diet controlled: Secondary | ICD-10-CM | POA: Diagnosis not present

## 2019-03-06 DIAGNOSIS — Z3A36 36 weeks gestation of pregnancy: Secondary | ICD-10-CM

## 2019-03-06 DIAGNOSIS — O0993 Supervision of high risk pregnancy, unspecified, third trimester: Secondary | ICD-10-CM

## 2019-03-06 DIAGNOSIS — O24419 Gestational diabetes mellitus in pregnancy, unspecified control: Secondary | ICD-10-CM

## 2019-03-06 LAB — FETAL NONSTRESS TEST

## 2019-03-06 LAB — POCT URINALYSIS DIPSTICK OB: Glucose, UA: NEGATIVE

## 2019-03-06 MED ORDER — GLYBURIDE 2.5 MG PO TABS: ORAL_TABLET | ORAL | 1 refills | Status: DC

## 2019-03-06 NOTE — Progress Notes (Signed)
Routine Prenatal Care Visit  Subjective  Cheyenne Gomez is a 28 y.o. [id number] at 2261w6d being seen today for ongoing prenatal care.  She is currently monitored for the following issues for this high-risk pregnancy and has Calculus of gallbladder with biliary obstruction but without cholecystitis; Supervision of high-risk pregnancy; Rh negative state in antepartum period; Obesity in pregnancy, antepartum; Pyelonephritis affecting pregnancy in second trimester; Gestational diabetes mellitus (GDM) affecting fourth pregnancy; and Macrosomia on their problem list.  ----------------------------------------------------------------------------------- Patient reports no complaints.  Blood sugar log reviewed. 7 elevated numbers in the past week.  Contractions: Not present. Vag. Bleeding: None.  Movement: Present. Denies leaking of fluid.  ----------------------------------------------------------------------------------- The following portions of the patient's history were reviewed and updated as appropriate: allergies, current medications, past family history, past medical history, past social history, past surgical history and problem list. Problem list updated.   Objective  Blood pressure 118/68, weight 224 lb (101.6 kg), last menstrual period 06/15/2018. Pregravid weight 207 lb (93.9 kg) Total Weight Gain 17 lb (7.711 kg) Urinalysis: Urine Protein    Urine Glucose     Blood sugar record: 1 mildly elevated fasting (96), 3 mildly elevated after breakfast, 1 mildly elevated after lunch and 1 low after lunch, 2 elevated after dinner- all in 120s to 130.  Fetal Status: Fetal Heart Rate (bpm): 140   Movement: Present  Presentation: Vertex  NST reactive today- 20 minute tracing with 140 bpm baseline, moderate variability, +accelerations, -decelerations AFI level normal at recent DP visit  General:  Alert, oriented and cooperative. Patient is in no acute distress.  Skin: Skin is warm and dry. No  rash noted.   Cardiovascular: Normal heart rate noted  Respiratory: Normal respiratory effort, no problems with respiration noted  Abdomen: Soft, gravid, appropriate for gestational age. Pain/Pressure: Absent     Pelvic:  Cervical exam performed Dilation: 3.5 Effacement (%): 60 Station: -3  Extremities: Normal range of motion.  Edema: Trace  Mental Status: Normal mood and affect. Normal behavior. Normal judgment and thought content.   Assessment   28 y.o. [id number] at 2661w6d by  03/28/2019, by Ultrasound presenting for routine prenatal visit  Plan   Pregnancy #5 Problems (from 08/15/18 to present)    Problem Noted Resolved   Gestational diabetes mellitus (GDM) affecting fourth pregnancy 01/15/2019 by Natale MilchSchuman, Christanna R, MD No   Overview Signed 01/15/2019 11:17 AM by Natale MilchSchuman, Christanna R, MD    Current Diabetic Medications:  None  [ ]  Aspirin 81 mg daily after 12 weeks; discontinue after 36 weeks (? A2/B GDM)  Required Referrals for A1GDM or A2GDM: [ ]  Diabetes Education and Testing Supplies [ ]  Nutrition Cousult  For A2/B GDM or higher classes of DM [ ]  Diabetes Education and Testing Supplies [ ]  Nutrition Counsult [ ]  Fetal ECHO after 22-24 weeks  [ ]  Eye exam for retina evaluation  [ ]  Baseline EKG [ ]  US fetal growth every 4 weeks starting at 28 weeks [ ]  Twice weekly NST starting at [redacted] weeks gestation [ ]  Delivery planning contingent on fetal growth, AFI, glycemic control, and other co-morbidities but at least by 39 weeks  Baseline and surveillance labs ([facility], refresh links as needed)  Lab Results  Component Value Date   CREATININE 0.51 11/04/2018   AST 22 07/30/2018   ALT 24 07/30/2018   LABPROT 12.6 11/04/2018   Lab Results  Component Value Date   HGBA1C 5.5 10/14/2018    Antenatal Testing Class of DM  U/S NST/AFI DELIVERY  Diabetes   A1 - good control - O24.410    A2 - good control - O24.419      A2  - poor control or poor compliance -  O24.419, E11.65   (Macrosomia or polyhydramnios) **E11.65 is extra code for poor control**    A2/B - O24.919  and B-C O24.319  Poor control B-C or D-R-F-T - O24.319  or  Type I DM - O24.019  20-38  20-38  20-24-28-32-36   20-24-28-32-35-38//fetal echo  20-24-27-30-33-36-38//fetal echo  40  32//2 x wk  32//2 x wk   32//2 x wk  28//BPP wkly then 32//2 x wk  40  39  PRN   39  PRN          Obesity in pregnancy, antepartum 09/12/2018 by Homero Fellers, MD No   Supervision of high-risk pregnancy 08/27/2018 by Malachy Mood, MD No   Overview Addendum 02/04/2019  8:27 AM by Dalia Heading, [place] Prenatal Labs  Dating 7 week [place] Blood type: A negative  Genetic Screen Declines Antibody: negative  Anatomic [place] complete Rubella: Immune Varicella: Immune  GTT Early: hgbA1C 5.5 Third trimester: ELEVATED 3hr (100/221/119/102 RPR: NR  Rhogam  [x ] 28 weeks-01/06/2019 HBsAg: negative  TDaP vaccine    01/22/2019                    Flu Shot: 09/12/2018 HIV: negative  Baby Food  Breast- experienced                              GBS:   Contraception  Desires tubal, understands may not be possible with COVID-19 outbreak Conceived this pregnancy with IUD- consent signed 5/26 Pap: 08/05/2018 NIL HPV positive  CBB     CS/VBAC  hx vaginal deliveries  pyelo in pregnancy  Support Person            Rh negative state in antepartum period 08/27/2018 by Malachy Mood, MD No      DP suggested raising Glyburide to 5 mg BID. Discussed with Dr Glennon Mac who suggested 5 mg in AM and continue with 2.5 at PM.  Preterm labor symptoms and general obstetric precautions including but not limited to vaginal bleeding, contractions, leaking of fluid and fetal movement were reviewed in detail with the patient. Please refer to After Visit Summary for other counseling recommendations.   Return in about 4 days (around 03/10/2019) for afi/nst/rob.  Rod Can, CNM  03/06/2019 3:55 PM

## 2019-03-06 NOTE — Progress Notes (Signed)
NST today. No complaints. 

## 2019-03-06 NOTE — Patient Instructions (Signed)
Contracciones de Braxton Hicks Braxton Hicks Contractions Las contracciones del tero pueden presentarse durante todo el embarazo, pero no siempre indican que la mujer est de parto. Es posible que usted haya tenido contracciones de prctica llamadas "contracciones de Braxton Hicks". A veces, se las confunde con el parto real. Qu son las contracciones de Braxton Hicks? Las contracciones de Braxton Hicks son espasmos que se producen en los msculos del tero antes del parto. A diferencia de las contracciones del parto verdadero, estas no producen el agrandamiento (la dilatacin) ni el afinamiento del cuello uterino. Hacia el final del embarazo (entre las semanas 32y34), las contracciones de Braxton Hicks pueden presentarse ms seguido y tornarse ms intensas. A veces, resulta difcil distinguirlas del parto verdadero porque pueden ser muy molestas. No debe sentirse avergonzada si concurre al hospital con falso parto. En ocasiones, la nica forma de saber si el trabajo de parto es verdadero es que el mdico determine si hay cambios en el cuello del tero. El mdico le har un examen fsico y quizs le controle las contracciones. Si usted no est de parto verdadero, el examen debe indicar que el cuello uterino no est dilatado y que usted no ha roto bolsa. Si no hay otros problemas de salud asociados con su embarazo, no habr inconvenientes si la envan a su casa con un falso parto. Es posible que las contracciones de Braxton Hicks continen hasta que se desencadene el parto verdadero. Cmo diferenciar el trabajo de parto falso del verdadero Trabajo de parto verdadero  Las contracciones duran de 30a70segundos.  Las contracciones pueden tornarse muy regulares.  La molestia generalmente se siente en la parte superior del tero y se extiende hacia la zona baja del abdomen y hacia la cintura.  Las contracciones no desaparecen cuando usted camina.  Las contracciones generalmente se hacen ms  intensas y aumentan en frecuencia.  El cuello uterino se dilata y se afina. Parto falso  En general, las contracciones son ms cortas y no tan intensas como las del parto verdadero.  En general, las contracciones son irregulares.  A menudo, las contracciones se sienten en la parte delantera de la parte baja del abdomen y en la ingle.  Las contracciones pueden desaparecer cuando usted camina o cambia de posicin mientras est acostada.  Las contracciones se vuelven ms dbiles y su duracin es menor a medida que transcurre el tiempo.  En general, el cuello uterino no se dilata ni se afina. Siga estas indicaciones en su casa:   Tome los medicamentos de venta libre y los recetados solamente como se lo haya indicado el mdico.  Contine haciendo los ejercicios habituales y siga las dems indicaciones que el mdico le d.  Coma y beba con moderacin si cree que est de parto.  Si las contracciones de Braxton Hicks le provocan incomodidad: ? Cambie de posicin: si est acostada o descansando, camine; si est caminando, descanse. ? Sintese y descanse en una baera con agua tibia. ? Beba suficiente lquido como para mantener la orina de color amarillo plido. La deshidratacin puede provocar contracciones. ? Respire lenta y profundamente varias veces por hora.  Vaya a todas las visitas de control prenatales y de control como se lo haya indicado el mdico. Esto es importante. Comunquese con un mdico si:  Tiene fiebre.  Siente dolor constante en el abdomen. Solicite ayuda de inmediato si:  Las contracciones se intensifican, se hacen ms regulares y cercanas entre s.  Tiene una prdida de lquido por la vagina.  Elimina   una mucosidad sanguinolenta (prdida del tapn mucoso).  Tiene una hemorragia vaginal.  Tiene un dolor en la zona lumbar que nunca tuvo antes.  Siente que la cabeza del beb empuja hacia abajo y ejerce presin en la zona plvica.  El beb no se mueve tanto  como antes. Resumen  Las contracciones que se presentan antes del parto se conocen como contracciones de Braxton Hicks, falso parto o contracciones de prctica.  En general, las contracciones de Braxton Hicks son ms cortas, ms dbiles, con ms tiempo entre una y otra, y menos regulares que las contracciones del parto verdadero. Las contracciones del parto verdadero se intensifican progresivamente y se tornan regulares y ms frecuentes.  Para controlar la molestia que producen las contracciones de Braxton Hicks, puede cambiar de posicin, darse un bao templado y descansar, beber mucha agua o practicar la respiracin profunda. Esta informacin no tiene como fin reemplazar el consejo del mdico. Asegrese de hacerle al mdico cualquier pregunta que tenga. Document Released: 04/02/2017 Document Revised: 11/30/2017 Document Reviewed: 04/02/2017 Elsevier Patient Education  2020 [employer]  

## 2019-03-10 ENCOUNTER — Ambulatory Visit (INDEPENDENT_AMBULATORY_CARE_PROVIDER_SITE_OTHER): Payer: BC Managed Care – PPO | Admitting: Advanced Practice Midwife

## 2019-03-10 ENCOUNTER — Encounter: Payer: Self-pay | Admitting: Advanced Practice Midwife

## 2019-03-10 ENCOUNTER — Other Ambulatory Visit: Payer: Medicaid Other

## 2019-03-10 ENCOUNTER — Other Ambulatory Visit: Payer: Self-pay

## 2019-03-10 ENCOUNTER — Ambulatory Visit (INDEPENDENT_AMBULATORY_CARE_PROVIDER_SITE_OTHER): Payer: BC Managed Care – PPO

## 2019-03-10 ENCOUNTER — Encounter: Payer: Medicaid Other | Admitting: Advanced Practice Midwife

## 2019-03-10 VITALS — BP 128/84 | Wt 224.0 lb

## 2019-03-10 DIAGNOSIS — Z3A37 37 weeks gestation of pregnancy: Secondary | ICD-10-CM | POA: Diagnosis not present

## 2019-03-10 DIAGNOSIS — O0993 Supervision of high risk pregnancy, unspecified, third trimester: Secondary | ICD-10-CM

## 2019-03-10 DIAGNOSIS — Z3689 Encounter for other specified antenatal screening: Secondary | ICD-10-CM | POA: Diagnosis not present

## 2019-03-10 DIAGNOSIS — O24410 Gestational diabetes mellitus in pregnancy, diet controlled: Secondary | ICD-10-CM | POA: Diagnosis not present

## 2019-03-10 DIAGNOSIS — O24419 Gestational diabetes mellitus in pregnancy, unspecified control: Secondary | ICD-10-CM

## 2019-03-10 NOTE — Progress Notes (Signed)
U/s and NST today.

## 2019-03-10 NOTE — Progress Notes (Signed)
Routine Prenatal Care Visit  Subjective  Guss BundeYamilet Vernie MurdersGarcia Chavez is a 28 y.o. [phone] at 3266w3d being seen today for ongoing prenatal care.  She is currently monitored for the following issues for this high-risk pregnancy and has Calculus of gallbladder with biliary obstruction but without cholecystitis; Supervision of high-risk pregnancy; Rh negative state in antepartum period; Obesity in pregnancy, antepartum; Pyelonephritis affecting pregnancy in second trimester; Gestational diabetes mellitus (GDM) affecting fourth pregnancy; and Macrosomia on their problem list.  ----------------------------------------------------------------------------------- Patient reports no complaints.   Contractions: Not present. Vag. Bleeding: None.  Movement: Present. Denies leaking of fluid.  ----------------------------------------------------------------------------------- The following portions of the patient's history were reviewed and updated as appropriate: allergies, current medications, past family history, past medical history, past social history, past surgical history and problem list. Problem list updated.   Objective  Blood pressure 128/84, weight 224 lb (101.6 kg), last menstrual period 06/15/2018. Pregravid weight 207 lb (93.9 kg) Total Weight Gain 17 lb (7.711 kg) Urinalysis: Urine Protein    Urine Glucose     Blood sugar log: since last visit all fasting levels are normal, 2 postprandial are in 130s and otherwise normal.  Fetal Status: Fetal Heart Rate (bpm): 140   Movement: Present     AFI today is 20 cm NST: Reactive 20 minute tracing, baseline is 140 bpm, moderate variability, +accelerations to 170, -decelerations  General:  Alert, oriented and cooperative. Patient is in no acute distress.  Skin: Skin is warm and dry. No rash noted.   Cardiovascular: Normal heart rate noted  Respiratory: Normal respiratory effort, no problems with respiration noted  Abdomen: Soft, gravid, appropriate for  gestational age. Pain/Pressure: Absent     Pelvic:  Cervical exam deferred        Extremities: Normal range of motion.  Edema: Trace  Mental Status: Normal mood and affect. Normal behavior. Normal judgment and thought content.   Assessment   28 y.o. [id number] at 4366w3d by  03/28/2019, by Ultrasound presenting for routine prenatal visit  Plan   Pregnancy #5 Problems (from 08/15/18 to present)    Problem Noted Resolved   Gestational diabetes mellitus (GDM) affecting fourth pregnancy 01/15/2019 by Natale MilchSchuman, Christanna R, MD No   Overview Signed 01/15/2019 11:17 AM by Natale MilchSchuman, Christanna R, MD    Current Diabetic Medications:  None  [ ]  Aspirin 81 mg daily after 12 weeks; discontinue after 36 weeks (? A2/B GDM)  Required Referrals for A1GDM or A2GDM: [ ]  Diabetes Education and Testing Supplies [ ]  Nutrition Cousult  For A2/B GDM or higher classes of DM [ ]  Diabetes Education and Testing Supplies [ ]  Nutrition Counsult [ ]  Fetal ECHO after 22-24 weeks  [ ]  Eye exam for retina evaluation  [ ]  Baseline EKG [ ]  US fetal growth every 4 weeks starting at 28 weeks [ ]  Twice weekly NST starting at [redacted] weeks gestation [ ]  Delivery planning contingent on fetal growth, AFI, glycemic control, and other co-morbidities but at least by 39 weeks  [facility] and surveillance [facility] refresh links as needed)  Lab Results  Component Value Date   CREATININE 0.51 11/04/2018   AST 22 07/30/2018   ALT 24 07/30/2018   LABPROT 12.6 11/04/2018   Lab Results  Component Value Date   HGBA1C 5.5 10/14/2018    Antenatal Testing Class of DM U/S NST/AFI DELIVERY  Diabetes   A1 - good control - O24.410    A2 - good control - O24.419  A2  - poor control or poor compliance - O24.419, E11.65   (Macrosomia or polyhydramnios) **E11.65 is extra code for poor control**    A2/B - O24.919  and B-C O24.319  Poor control B-C or D-R-F-T - O24.319  or  Type I DM - O24.019  20-38  20-38   20-24-28-32-36   20-24-28-32-35-38//fetal echo  20-24-27-30-33-36-38//fetal echo  40  32//2 x wk  32//2 x wk   32//2 x wk  28//BPP wkly then 32//2 x wk  40  39  PRN   39  PRN          Obesity in pregnancy, antepartum 09/12/2018 by Homero Fellers, MD No   Supervision of high-risk pregnancy 08/27/2018 by Malachy Mood, MD No   Overview Addendum 02/04/2019  8:27 AM by Dalia Heading, [place] Prenatal Labs  Dating 7 week [place] Blood type: A negative  Genetic Screen Declines Antibody: negative  Anatomic [place] complete Rubella: Immune Varicella: Immune  GTT Early: hgbA1C 5.5 Third trimester: ELEVATED 3hr (100/221/119/102 RPR: NR  Rhogam  [x ] 28 weeks-01/06/2019 HBsAg: negative  TDaP vaccine    01/22/2019                    Flu Shot: 09/12/2018 HIV: negative  Baby Food  Breast- experienced                              GBS:   Contraception  Desires tubal, understands may not be possible with COVID-19 outbreak Conceived this pregnancy with IUD- consent signed 5/26 Pap: 08/05/2018 NIL HPV positive  CBB     CS/VBAC  hx vaginal deliveries  pyelo in pregnancy  Support Person            Rh negative state in antepartum period 08/27/2018 by Malachy Mood, MD No       Term labor symptoms and general obstetric precautions including but not limited to vaginal bleeding, contractions, leaking of fluid and fetal movement were reviewed in detail with the patient. Please refer to After Visit Summary for other counseling recommendations.   Return on Thursday 7/9 for NST and ROB Will need to schedule 7/13 visit for AFI/NST/ROB [facility] 7/15 between 9 and 10 am for covid swab (order placed today) Go to ER to be taken to L&D 7/17 at 8:30 am for induction of labor- (iol scheduled)  Return for has follow up scheduled.  Rod Can, CNM 03/10/2019 4:32 PM

## 2019-03-10 NOTE — Addendum Note (Signed)
Addended by: Rod Can on: 03/10/2019 05:32 PM   Modules accepted: Orders

## 2019-03-13 ENCOUNTER — Ambulatory Visit (INDEPENDENT_AMBULATORY_CARE_PROVIDER_SITE_OTHER): Payer: BC Managed Care – PPO | Admitting: Maternal Newborn

## 2019-03-13 ENCOUNTER — Other Ambulatory Visit: Payer: Self-pay | Admitting: Advanced Practice Midwife

## 2019-03-13 ENCOUNTER — Other Ambulatory Visit: Payer: Self-pay

## 2019-03-13 ENCOUNTER — Encounter: Payer: Medicaid Other | Admitting: Maternal Newborn

## 2019-03-13 VITALS — BP 108/70 | Wt 224.4 lb

## 2019-03-13 DIAGNOSIS — Z3A37 37 weeks gestation of pregnancy: Secondary | ICD-10-CM | POA: Diagnosis not present

## 2019-03-13 DIAGNOSIS — O0993 Supervision of high risk pregnancy, unspecified, third trimester: Secondary | ICD-10-CM

## 2019-03-13 DIAGNOSIS — O24410 Gestational diabetes mellitus in pregnancy, diet controlled: Secondary | ICD-10-CM | POA: Diagnosis not present

## 2019-03-13 DIAGNOSIS — O26893 Other specified pregnancy related conditions, third trimester: Secondary | ICD-10-CM

## 2019-03-13 DIAGNOSIS — O24419 Gestational diabetes mellitus in pregnancy, unspecified control: Secondary | ICD-10-CM

## 2019-03-13 LAB — POCT URINALYSIS DIPSTICK OB
Glucose, UA: NEGATIVE
POC,PROTEIN,UA: NEGATIVE

## 2019-03-13 NOTE — Progress Notes (Signed)
Routine Prenatal Care Visit  Subjective  Cheyenne Gomez is a 28 y.o. [phone] at 6963w6d being seen today for ongoing prenatal care.  She is currently monitored for the following issues for this high-risk pregnancy and has Calculus of gallbladder with biliary obstruction but without cholecystitis; Supervision of high-risk pregnancy; Rh negative state in antepartum period; Obesity in pregnancy, antepartum; Pyelonephritis affecting pregnancy in second trimester; Gestational diabetes mellitus (GDM) affecting fourth pregnancy; and Macrosomia on their problem list.  ----------------------------------------------------------------------------------- Patient reports no complaints.   Contractions: Not present. Vag. Bleeding: None.  Movement: Present. No leaking of fluid.  ----------------------------------------------------------------------------------- The following portions of the patient's history were reviewed and updated as appropriate: allergies, current medications, past family history, past medical history, past social history, past surgical history and problem list. Problem list updated.   Objective  Blood pressure 108/70, weight 224 lb 6.4 oz (101.8 kg), last menstrual period 06/15/2018. Pregravid weight 207 lb (93.9 kg) Total Weight Gain 17 lb 6.4 oz (7.893 kg) Urinalysis: Urine dipstick shows negative for glucose, protein.  Fetal Status: Fetal Heart Rate (bpm): 145   Movement: Present     General:  Alert, oriented and cooperative. Patient is in no acute distress.  Skin: Skin is warm and dry. No rash noted.   Cardiovascular: Normal heart rate noted  Respiratory: Normal respiratory effort, no problems with respiration noted  Abdomen: Soft, gravid, appropriate for gestational age. Pain/Pressure: Absent     Pelvic:  Cervical exam deferred        Extremities: Normal range of motion.  Edema: Trace  Mental Status: Normal mood and affect. Normal behavior. Normal judgment and thought  content.   NST Baseline:145 Variability: moderate Accelerations: present Decelerations: absent Tocometry: not done The patient was monitored for 20 minutes, fetal heart rate tracing was deemed reactive.  Assessment   27 y.o. [id number] at 4463w6d EDD 03/28/2019, by Ultrasound presenting for a routine prenatal visit.  Plan   Pregnancy #5 Problems (from 08/15/18 to present)    Problem Noted Resolved   Gestational diabetes mellitus (GDM) affecting fourth pregnancy 01/15/2019 by Natale MilchSchuman, Christanna R, MD No   Overview Signed 01/15/2019 11:17 AM by Natale MilchSchuman, Christanna R, MD    Current Diabetic Medications:  None  [ ]  Aspirin 81 mg daily after 12 weeks; discontinue after 36 weeks (? A2/B GDM)  Required Referrals for A1GDM or A2GDM: [ ]  Diabetes Education and Testing Supplies [ ]  Nutrition Cousult  For A2/B GDM or higher classes of DM [ ]  Diabetes Education and Testing Supplies [ ]  Nutrition Counsult [ ]  Fetal ECHO after 22-24 weeks  [ ]  Eye exam for retina evaluation  [ ]  Baseline EKG [ ]  US fetal growth every 4 weeks starting at 28 weeks [ ]  Twice weekly NST starting at [redacted] weeks gestation [ ]  Delivery planning contingent on fetal growth, AFI, glycemic control, and other co-morbidities but at least by 39 weeks  Baseline and surveillance labs [facility] refresh links as needed)  Lab Results  Component Value Date   CREATININE 0.51 11/04/2018   AST 22 07/30/2018   ALT 24 07/30/2018   LABPROT 12.6 11/04/2018   Lab Results  Component Value Date   HGBA1C 5.5 10/14/2018    Antenatal Testing Class of DM U/S NST/AFI DELIVERY  Diabetes   A1 - good control - O24.410    A2 - good control - O24.419      A2  - poor control or poor compliance - O24.419, E11.65   (  Macrosomia or polyhydramnios) **E11.65 is extra code for poor control**    A2/B - O24.919  and B-C O24.319  Poor control B-C or D-R-F-T - O24.319  or  Type I DM - O24.019  20-38  20-38  20-24-28-32-36    20-24-28-32-35-38//fetal echo  20-24-27-30-33-36-38//fetal echo  40  32//2 x wk  32//2 x wk   32//2 x wk  28//BPP wkly then 32//2 x wk  40  39  PRN   39  PRN          Obesity in pregnancy, antepartum 09/12/2018 by Homero Fellers, MD No   Supervision of high-risk pregnancy 08/27/2018 by Malachy Mood, MD No   Overview Addendum 02/04/2019  8:27 AM by Dalia Heading, [place] Prenatal Labs  Dating 7 week [place] Blood type: A negative  Genetic Screen Declines Antibody: negative  Anatomic [place] complete Rubella: Immune Varicella: Immune  GTT Early: hgbA1C 5.5 Third trimester: ELEVATED 3hr (100/221/119/102 RPR: NR  Rhogam  [x ] 28 weeks-01/06/2019 HBsAg: negative  TDaP vaccine    01/22/2019                    Flu Shot: 09/12/2018 HIV: negative  Baby Food  Breast- experienced                              GBS:   Contraception  Desires tubal, understands may not be possible with COVID-19 outbreak Conceived this pregnancy with IUD- consent signed 5/26 Pap: 08/05/2018 NIL HPV positive  CBB     CS/VBAC  hx vaginal deliveries  pyelo in pregnancy  Support Person            Rh negative state in antepartum period 08/27/2018 by Malachy Mood, MD No    Reactive NST today. Patient has induction scheduled on 7/17.  Blood glucose log shows good control, all fasting and postprandial values within range since last visit. Fasting values 73-82; postprandial values 98-121.  Term labor symptoms and general obstetric precautions including but not limited to vaginal bleeding, contractions, leaking of fluid and fetal movement were reviewed.  Please refer to After Visit Summary for other counseling recommendations.   Return in about 4 days (around 03/17/2019) for ROB with NST.  Cheyenne Gomez, CNM 03/13/2019  4:39 PM

## 2019-03-13 NOTE — Progress Notes (Unsigned)
Induction orders signed and held

## 2019-03-13 NOTE — Progress Notes (Signed)
ROB/NST- no concerns 

## 2019-03-17 ENCOUNTER — Ambulatory Visit (INDEPENDENT_AMBULATORY_CARE_PROVIDER_SITE_OTHER): Payer: BC Managed Care – PPO | Admitting: Certified Nurse Midwife

## 2019-03-17 ENCOUNTER — Other Ambulatory Visit: Payer: Self-pay

## 2019-03-17 ENCOUNTER — Encounter: Payer: Self-pay | Admitting: Certified Nurse Midwife

## 2019-03-17 VITALS — BP 112/60 | Wt 230.8 lb

## 2019-03-17 DIAGNOSIS — Z3A38 38 weeks gestation of pregnancy: Secondary | ICD-10-CM | POA: Diagnosis not present

## 2019-03-17 DIAGNOSIS — O24419 Gestational diabetes mellitus in pregnancy, unspecified control: Secondary | ICD-10-CM

## 2019-03-17 DIAGNOSIS — O24415 Gestational diabetes mellitus in pregnancy, controlled by oral hypoglycemic drugs: Secondary | ICD-10-CM | POA: Diagnosis not present

## 2019-03-17 DIAGNOSIS — O0993 Supervision of high risk pregnancy, unspecified, third trimester: Secondary | ICD-10-CM

## 2019-03-17 LAB — POCT URINALYSIS DIPSTICK OB: Glucose, UA: NEGATIVE

## 2019-03-17 NOTE — Progress Notes (Signed)
ROB- no concerns 

## 2019-03-19 ENCOUNTER — Other Ambulatory Visit: Payer: Self-pay

## 2019-03-19 ENCOUNTER — Other Ambulatory Visit
Admission: RE | Admit: 2019-03-19 | Discharge: 2019-03-19 | Disposition: A | Discharge status: Home / Self Care | Payer: BC Managed Care – PPO | Source: Ambulatory Visit | Attending: Advanced Practice Midwife | Admitting: Advanced Practice Midwife

## 2019-03-19 ENCOUNTER — Encounter: Payer: Self-pay | Admitting: Maternal Newborn

## 2019-03-19 DIAGNOSIS — Z1159 Encounter for screening for other viral diseases: Secondary | ICD-10-CM | POA: Diagnosis not present

## 2019-03-19 LAB — SARS CORONAVIRUS 2 (TAT 6-24 HRS): SARS Coronavirus 2: NEGATIVE

## 2019-03-21 ENCOUNTER — Other Ambulatory Visit: Payer: Self-pay | Admitting: Advanced Practice Midwife

## 2019-03-21 ENCOUNTER — Other Ambulatory Visit: Payer: Self-pay

## 2019-03-21 ENCOUNTER — Inpatient Hospital Stay
Admission: EM | Admit: 2019-03-21 | Discharge: 2019-03-23 | DRG: 798 | Disposition: A | Discharge status: Home / Self Care | Payer: BC Managed Care – PPO | Attending: Obstetrics and Gynecology | Admitting: Obstetrics and Gynecology

## 2019-03-21 DIAGNOSIS — O0993 Supervision of high risk pregnancy, unspecified, third trimester: Secondary | ICD-10-CM

## 2019-03-21 DIAGNOSIS — Z87440 Personal history of urinary (tract) infections: Secondary | ICD-10-CM

## 2019-03-21 DIAGNOSIS — O9962 Diseases of the digestive system complicating childbirth: Secondary | ICD-10-CM | POA: Diagnosis present

## 2019-03-21 DIAGNOSIS — Z1159 Encounter for screening for other viral diseases: Secondary | ICD-10-CM | POA: Diagnosis not present

## 2019-03-21 DIAGNOSIS — K8020 Calculus of gallbladder without cholecystitis without obstruction: Secondary | ICD-10-CM | POA: Diagnosis present

## 2019-03-21 DIAGNOSIS — Z302 Encounter for sterilization: Secondary | ICD-10-CM

## 2019-03-21 DIAGNOSIS — O26893 Other specified pregnancy related conditions, third trimester: Secondary | ICD-10-CM | POA: Diagnosis not present

## 2019-03-21 DIAGNOSIS — O3663X0 Maternal care for excessive fetal growth, third trimester, not applicable or unspecified: Secondary | ICD-10-CM | POA: Diagnosis present

## 2019-03-21 DIAGNOSIS — Z6791 Unspecified blood type, Rh negative: Secondary | ICD-10-CM | POA: Diagnosis not present

## 2019-03-21 DIAGNOSIS — Z3A39 39 weeks gestation of pregnancy: Secondary | ICD-10-CM

## 2019-03-21 DIAGNOSIS — O24415 Gestational diabetes mellitus in pregnancy, controlled by oral hypoglycemic drugs: Secondary | ICD-10-CM

## 2019-03-21 DIAGNOSIS — Z3490 Encounter for supervision of normal pregnancy, unspecified, unspecified trimester: Secondary | ICD-10-CM

## 2019-03-21 DIAGNOSIS — O24419 Gestational diabetes mellitus in pregnancy, unspecified control: Secondary | ICD-10-CM

## 2019-03-21 DIAGNOSIS — O99210 Obesity complicating pregnancy, unspecified trimester: Secondary | ICD-10-CM

## 2019-03-21 DIAGNOSIS — O24425 Gestational diabetes mellitus in childbirth, controlled by oral hypoglycemic drugs: Secondary | ICD-10-CM | POA: Diagnosis not present

## 2019-03-21 DIAGNOSIS — Z409 Encounter for prophylactic surgery, unspecified: Secondary | ICD-10-CM | POA: Diagnosis not present

## 2019-03-21 DIAGNOSIS — O2662 Liver and biliary tract disorders in childbirth: Secondary | ICD-10-CM | POA: Diagnosis not present

## 2019-03-21 DIAGNOSIS — Z8759 Personal history of other complications of pregnancy, childbirth and the puerperium: Secondary | ICD-10-CM

## 2019-03-21 LAB — GLUCOSE, CAPILLARY
Glucose-Capillary: 142 mg/dL — ABNORMAL HIGH (ref 70–99)
Glucose-Capillary: 68 mg/dL — ABNORMAL LOW (ref 70–99)

## 2019-03-21 LAB — CBC
HCT: 36.4 % (ref 36.0–46.0)
Hemoglobin: 11.9 g/dL — ABNORMAL LOW (ref 12.0–15.0)
MCH: 28.5 pg (ref 26.0–34.0)
MCHC: 32.7 g/dL (ref 30.0–36.0)
MCV: 87.1 fL (ref 80.0–100.0)
Platelets: 160 10*3/uL (ref 150–400)
RBC: 4.18 MIL/uL (ref 3.87–5.11)
RDW: 13.8 % (ref 11.5–15.5)
WBC: 6.6 10*3/uL (ref 4.0–10.5)
nRBC: 0 % (ref 0.0–0.2)

## 2019-03-21 LAB — TYPE AND SCREEN
ABO/RH(D): A NEG
Antibody Screen: NEGATIVE

## 2019-03-21 MED ORDER — LACTATED RINGERS IV SOLN
INTRAVENOUS | Status: DC
  Administered 2019-03-21: 10:00:00 via INTRAVENOUS

## 2019-03-21 MED ORDER — ACETAMINOPHEN 325 MG PO TABS: 650.0000 mg | ORAL_TABLET | ORAL | Status: DC | PRN

## 2019-03-21 MED ORDER — MISOPROSTOL 25 MCG QUARTER TABLET
25.0000 ug | ORAL_TABLET | ORAL | Status: DC | PRN
  Filled 2019-03-21: qty 1

## 2019-03-21 MED ORDER — ACETAMINOPHEN 325 MG PO TABS
650.0000 mg | ORAL_TABLET | ORAL | Status: DC | PRN
  Administered 2019-03-22: 650 mg via ORAL
  Filled 2019-03-21: qty 2

## 2019-03-21 MED ORDER — ONDANSETRON HCL 4 MG PO TABS: 4.0000 mg | ORAL_TABLET | ORAL | Status: DC | PRN

## 2019-03-21 MED ORDER — IBUPROFEN 600 MG PO TABS
600.0000 mg | ORAL_TABLET | Freq: Four times a day (QID) | ORAL | Status: DC
  Administered 2019-03-21 – 2019-03-23 (×8): 600 mg via ORAL
  Filled 2019-03-21 (×7): qty 1

## 2019-03-21 MED ORDER — SENNOSIDES-DOCUSATE SODIUM 8.6-50 MG PO TABS
2.0000 | ORAL_TABLET | ORAL | Status: DC
  Administered 2019-03-21 – 2019-03-22 (×2): 2 via ORAL
  Filled 2019-03-21 (×2): qty 2

## 2019-03-21 MED ORDER — IBUPROFEN 600 MG PO TABS
ORAL_TABLET | ORAL | Status: AC
  Filled 2019-03-21: qty 1

## 2019-03-21 MED ORDER — BENZOCAINE-MENTHOL 20-0.5 % EX AERO: 1.0000 "application " | INHALATION_SPRAY | CUTANEOUS | Status: DC | PRN

## 2019-03-21 MED ORDER — DIPHENHYDRAMINE HCL 25 MG PO CAPS: 25.0000 mg | ORAL_CAPSULE | Freq: Four times a day (QID) | ORAL | Status: DC | PRN

## 2019-03-21 MED ORDER — OXYTOCIN 40 UNITS IN NORMAL SALINE INFUSION - SIMPLE MED: 2.5000 [IU]/h | INTRAVENOUS | Status: DC

## 2019-03-21 MED ORDER — PRENATAL MULTIVITAMIN CH
1.0000 | ORAL_TABLET | Freq: Every day | ORAL | Status: DC
  Administered 2019-03-23: 1 via ORAL
  Filled 2019-03-21: qty 1

## 2019-03-21 MED ORDER — TERBUTALINE SULFATE 1 MG/ML IJ SOLN: 0.2500 mg | Freq: Once | INTRAMUSCULAR | Status: DC | PRN

## 2019-03-21 MED ORDER — OXYTOCIN BOLUS FROM INFUSION
500.0000 mL | Freq: Once | INTRAVENOUS | Status: AC
  Administered 2019-03-21: 500 mL via INTRAVENOUS

## 2019-03-21 MED ORDER — AMMONIA AROMATIC IN INHA
RESPIRATORY_TRACT | Status: AC
  Filled 2019-03-21: qty 10

## 2019-03-21 MED ORDER — SIMETHICONE 80 MG PO CHEW: 80.0000 mg | CHEWABLE_TABLET | ORAL | Status: DC | PRN

## 2019-03-21 MED ORDER — MISOPROSTOL 200 MCG PO TABS
ORAL_TABLET | ORAL | Status: AC
  Filled 2019-03-21: qty 4

## 2019-03-21 MED ORDER — WITCH HAZEL-GLYCERIN EX PADS: 1.0000 "application " | MEDICATED_PAD | CUTANEOUS | Status: DC | PRN

## 2019-03-21 MED ORDER — OXYTOCIN 10 UNIT/ML IJ SOLN
INTRAMUSCULAR | Status: AC
  Filled 2019-03-21: qty 2

## 2019-03-21 MED ORDER — OXYTOCIN 40 UNITS IN NORMAL SALINE INFUSION - SIMPLE MED
1.0000 m[IU]/min | INTRAVENOUS | Status: DC
  Administered 2019-03-21: 2 m[IU]/min via INTRAVENOUS
  Filled 2019-03-21: qty 1000

## 2019-03-21 MED ORDER — DIBUCAINE (PERIANAL) 1 % EX OINT: 1.0000 "application " | TOPICAL_OINTMENT | CUTANEOUS | Status: DC | PRN

## 2019-03-21 MED ORDER — LIDOCAINE HCL (PF) 1 % IJ SOLN
INTRAMUSCULAR | Status: AC
  Filled 2019-03-21: qty 30

## 2019-03-21 MED ORDER — COCONUT OIL OIL
1.0000 "application " | TOPICAL_OIL | Status: DC | PRN
  Filled 2019-03-21: qty 120

## 2019-03-21 MED ORDER — LIDOCAINE HCL (PF) 1 % IJ SOLN: 30.0000 mL | INTRAMUSCULAR | Status: DC | PRN

## 2019-03-21 MED ORDER — LACTATED RINGERS IV SOLN: 500.0000 mL | INTRAVENOUS | Status: DC | PRN

## 2019-03-21 MED ORDER — ONDANSETRON HCL 4 MG/2ML IJ SOLN: 4.0000 mg | INTRAMUSCULAR | Status: DC | PRN

## 2019-03-21 MED ORDER — BUTORPHANOL TARTRATE 1 MG/ML IJ SOLN: 1.0000 mg | INTRAMUSCULAR | Status: DC | PRN

## 2019-03-21 MED ORDER — ONDANSETRON HCL 4 MG/2ML IJ SOLN: 4.0000 mg | Freq: Four times a day (QID) | INTRAMUSCULAR | Status: DC | PRN

## 2019-03-21 NOTE — Plan of Care (Signed)
Orient. Info given with interp. #[id number]

## 2019-03-21 NOTE — Progress Notes (Signed)
  Labor Progress Note   28 y.o. [id number] @ [redacted]w[redacted]d , admitted for  Pregnancy, Labor Management. IOL GDM A2  Subjective:  Patient is comfortable with infrequent contractions. Discussed plan of care utilizing interpreter services. Patient verbalizes understanding and prefers the option of cervical sweep, AROM and walking prior to starting pitocin.  Objective:  BP 115/68 (BP Location: Left Arm)   Pulse 88   Temp 97.9 F (36.6 C) (Oral)   Resp 16   Ht 5\' 3"  (1.6 m)   Wt 101.6 kg   LMP 06/15/2018   BMI 39.68 kg/m  Abd: gravid, ND, FHT present, mild tenderness on exam Extr: trace to 1+ bilateral pedal edema SVE: CERVIX: 5.5 cm dilated, 80 effaced, -2 station  EFM: FHR: 150 bpm, variability: moderate,  accelerations:  Present,  decelerations:  Absent Toco: Frequency: occasional Labs: I have reviewed the patient's lab results.   Assessment & Plan:  [id number] @ [redacted]w[redacted]d, admitted for  Pregnancy and Labor/Delivery Management  1. Pain management: none. 2. FWB: FHT category I.  3. ID: GBS negative 4. Labor management: will observe following AROM, sweep and walking and start pitocin as needed  All discussed with patient, see orders   Rod Can, [facility] 03/21/2019  11:25 AM

## 2019-03-21 NOTE — Progress Notes (Signed)
2 hr gtt ordered for postpartum visit

## 2019-03-21 NOTE — Discharge Summary (Signed)
OB Discharge Summary     Patient Name: Cheyenne Gomez DOB: 1991-04-18 MRN: [id number]  Date of admission: 03/21/2019 Delivering provider: Rod Can, CNM  Date of Delivery: 03/21/2019  Date of discharge: 03/23/2019  Admitting diagnosis: 39wks Induction Interpreter needed  Intrauterine pregnancy: [redacted]w[redacted]d    Secondary diagnosis: Gestational Diabetes medication controlled (A2), desires sterilization     Discharge diagnosis: Term Pregnancy Delivered and GDM A2                 Rh negative                                                                               Post partum procedures:rhogam, postpartum tubal ligation  Augmentation: AROM and Pitocin  Complications: None  Hospital course:  Induction of Labor With Vaginal Delivery   28y.o. yo [phone] [phone] admitted to the hospital 03/21/2019 for induction of labor.  Indication for induction: A2 DM.  Patient had an uncomplicated labor course as follows: Membrane Rupture Time/Date: 11:21 AM ,03/21/2019   [place]  Details of delivery can be found in separate delivery note.    Patient had an uncomplicated postpartum tubal ligation on PPD#1.  Her postpartum course was otherwise unremarkable. On PPD #2 she was afebrile, ambulating without assistance, voiding, and tolerating a regular diet. She was deemed stable for discharge.  Patient is discharged home 03/23/19.  Physical exam  Vitals:   03/22/19 2206 03/22/19 2329 03/23/19 0319 03/23/19 0830  BP: 100/64 105/65 99/65 99/70   Pulse: 67 65 (!) 59 (!) 54  Resp: 20 20 20 18   Temp: 98 F (36.7 C) 98 F (36.7 C) 97.7 F (36.5 C) 98.1 F (36.7 C)  TempSrc: Oral Oral Oral Oral  SpO2: 96% 97% 97% 98%  Weight:      Height:       General: alert, cooperative and no distress Lochia: appropriate Uterine Fundus: firm Incision: umbilical incision is C+D+I DVT Evaluation: No evidence of DVT seen on physical exam.  Labs: Lab  Results  Component Value Date   WBC 11.9 (H) 03/22/2019   HGB 11.4 (L) 03/22/2019   HCT 35.1 (L) 03/22/2019   MCV 88.0 03/22/2019   PLT 153 03/22/2019    Discharge instruction: per After Visit Summary.  Medications:  Allergies as of 03/23/2019   No Known Allergies     Medication List    STOP taking these medications   glyBURIDE 2.5 MG tablet Commonly known as: DIABETA     TAKE these medications   Accu-Chek Guide Me w/Device Kit 1 each by Does not apply route 4 (four) times daily.   accu-chek soft touch lancets Use as instructed   acetaminophen 325 MG tablet Commonly known as: Tylenol Take 2 tablets (650 mg total) by mouth every 4 (four) hours as needed for mild pain, moderate pain or headache (for pain scale < 4).   glucose blood test strip Commonly known as: Accu-Chek Guide Use as instructed   ibuprofen 600 MG tablet Commonly known as: ADVIL Take 1 tablet (600 mg total) by mouth every 6 (six) hours as needed for mild pain, moderate pain or  cramping.   multivitamin-prenatal 27-0.8 MG Tabs tablet Take 1 tablet by mouth daily at 12 noon.   nitrofurantoin (macrocrystal-monohydrate) 100 MG capsule Commonly known as: Macrobid Take 1 capsule (100 mg total) by mouth daily for 14 days.   oxyCODONE 5 MG immediate release tablet Commonly known as: Roxicodone Take 1 tablet (5 mg total) by mouth every 6 (six) hours as needed for up to 3 days for severe pain.       Diet: routine diet  Activity: Advance as tolerated. Pelvic rest for 6 weeks.   Outpatient follow up: Follow-up Information    Rod Can, CNM. Schedule an appointment as soon as possible for a visit in 6 week(s).   Specialty: Obstetrics Why: postpartum follow up visit- will need 2 hour gtt at visit Contact information: [address] [place] [address] [phone]             Postpartum contraception: Tubal Ligation Rhogam Given postpartum: yes Rubella vaccine given postpartum:  Rubella Immune Varicella vaccine given postpartum: Varicella Immune TDaP given antepartum or postpartum: given antepartum  Newborn Data: Live born female  Birth Weight: 9 lb 3.4 oz (4180 g) APGAR: 9, 9  Newborn Delivery   Birth date/time: 03/21/2019 16:14:00 Delivery type: Vaginal, Spontaneous       Baby Feeding: Breast  Disposition:home with mother  SIGNED:  Dalia Heading, CNM 03/23/2019 9:40 AM

## 2019-03-21 NOTE — H&P (Signed)
OB History & Physical   History of Present Illness:  Chief Complaint: here for induction of labor  HPI:  Cheyenne Gomez is a 28 y.o. [phone] female at 17w0ddated by ultrasound.  Her pregnancy has been complicated by calculus of gallbladder with biliary obstruction but without cholecystitis, Rh negative, obesity, pyelonephritis, gestational diabetes medication controlled, macrosomia.    She reports a few contractions.   She denies leakage of fluid.   She denies vaginal bleeding.   She reports fetal movement.  Patient was up walking after cervical sweep and rupture of membranes. She did not feel any increase of frequency of contractions and is ready for pitocin.  Maternal Medical History:   Past Medical History:  Diagnosis Date  . Calculus of gallbladder with biliary obstruction but without cholecystitis   . Gestational diabetes   . Pyelonephritis 2020    History reviewed. No pertinent surgical history.  No Known Allergies  Prior to Admission medications   Medication Sig Start Date End Date Taking? Authorizing Provider  glyBURIDE (DIABETA) 2.5 MG tablet Take 5 mg (2 tablets every morning with meal), Take 2.5 mg (1 tablet every night with meal) 03/06/19  Yes GRod Can CNM  nitrofurantoin, macrocrystal-monohydrate, (MACROBID) 100 MG capsule Take 1 capsule (100 mg total) by mouth daily. 11/11/18  Yes Schuman, Christanna R, MD  Prenatal Vit-Fe Fumarate-FA (MULTIVITAMIN-PRENATAL) 27-0.8 MG TABS tablet Take 1 tablet by mouth daily at 12 noon.   Yes [provider]  Blood Glucose Monitoring Suppl (ACCU-CHEK GUIDE ME) w/Device KIT 1 each by Does not apply route 4 (four) times daily. 01/22/19   SHomero Fellers MD  glucose blood (ACCU-CHEK GUIDE) test strip Use as instructed 01/22/19   SAdrian ProwsR, MD  Lancets (ACCU-CHEK SOFT TOUCH) lancets Use as instructed 01/24/19   SRexene Agent CNM    OB History  Gravida Para Term Preterm AB Living  _0 SAB  TAB Ectopic Multiple Live Births          1    # Outcome Date GA Lbr Len/2nd Weight Sex Delivery Anes PTL Lv  5 Current           4 Term 09/12/14    M Vag-Spont     3 Term 01/02/10    M Vag-Spont     2 Preterm 2009    F    ND  1 Term 04/09/06    [address]      Prenatal care site: [facility]  Social History: She  reports that she has never smoked. She has never used smokeless tobacco. She reports that she does not drink alcohol or use drugs.  Family History: family history includes Diabetes in her maternal grandfather.    Review of Systems:  Review of Systems  Constitutional: Negative.   HENT: Negative.   Eyes: Negative.   Respiratory: Negative.   Cardiovascular: Negative.   Gastrointestinal: Negative.   Genitourinary: Negative.   Musculoskeletal: Negative.   Skin: Negative.   Neurological: Negative.   Endo/Heme/Allergies: Negative.   Psychiatric/Behavioral: Negative.      Physical Exam:  BP 115/68 (BP Location: Left Arm)   Pulse 88   Temp 97.9 F (36.6 C) (Oral)   Resp 16   Ht _1  (1.6 m)   Wt 101.6 kg   LMP 06/15/2018   BMI 39.68 kg/m   Constitutional: Well nourished, well developed female in no acute distress.  HEENT: normal Skin: Warm and  dry.  Cardiovascular: Regular rate and rhythm.   Extremity: trace edema  Respiratory: Clear to auscultation bilateral. Normal respiratory effort Abdomen: FHT present and mild to palpation Back: no CVAT Neuro: DTRs 2+, Cranial nerves grossly intact Psych: Alert and Oriented x3. No memory deficits. Normal mood and affect.  MS: normal gait, normal bilateral lower extremity ROM/strength/stability.  Pelvic exam: (female chaperone present) is not limited by body habitus EGBUS: within normal limits Vagina: within normal limits and with normal mucosa  Cervix: 5.5/80/-2    Pertinent Results:  Prenatal Labs: Blood type/Rh A negative  Antibody screen negative  Rubella Immune  Varicella Immune    RPR  Non-reactive  HBsAg negative  HIV negative  GC negative  Chlamydia negative  Genetic screening Not done  1 hour GTT 201 01/06/19  3 hour GTT 100, 221, 119, 102  GBS negative on 6/25   Results for KAYSHA, PARSELL (MRN [id number]) as of 03/21/2019 12:43  Ref. Range 03/21/2019 10:33 03/21/2019 12:29  Glucose-Capillary Latest Ref Range: 70 - 99 mg/dL 142 (H) 68 (L)    Baseline FHR: 150 beats/min   Variability: moderate   Accelerations: present   Decelerations: absent Contractions: present frequency: every 20 minutes Overall assessment: reassuring   Lab Results  Component Value Date   [place] NEGATIVE 03/19/2019  ]  Assessment:  Cheyenne Gomez is a 28 y.o. [phone] female at 12w0dwith planned induction of labor.   Plan:  1. Admit to Labor & Delivery  2. CBC, T&S, Clrs, IVF 3. GBS negative.   4. Fetal well-being: Category I 5. Start pitocin, evaluate for cervical change  JRod Can CNM 03/21/2019 12:29 PM

## 2019-03-21 NOTE — Lactation Note (Signed)
This note was copied from a baby's chart. Lactation Consultation Note  Patient Name: Cheyenne Gomez Today's Date: 03/21/2019 Reason for consult: Initial assessment;1st time breastfeeding   Maternal Data Formula Feeding for Exclusion: No Does the patient have breastfeeding experience prior to this delivery?: Yes No spanish interpreter present, breastfed other children but unknown for how long  Feeding Feeding Type: Breast Fed  LATCH Score Latch: Grasps breast easily, tongue down, lips flanged, rhythmical sucking.  Audible Swallowing: Spontaneous and intermittent(burped when switched to right breast)  Type of Nipple: Everted at rest and after stimulation  Comfort (Breast/Nipple): Soft / non-tender  Hold (Positioning): Assistance needed to correctly position infant at breast and maintain latch.  LATCH Score: 9  Interventions Interventions: Assisted with latch;Skin to skin;Support pillows  Lactation Tools Discussed/Used     Consult Status Consult Status: Follow-up Date: 03/22/19 Follow-up type: In-patient    Ferol Luz 03/21/2019, 5:26 PM

## 2019-03-22 ENCOUNTER — Encounter
Admission: EM | Disposition: A | Discharge status: Home / Self Care | Payer: Self-pay | Source: Home / Self Care | Attending: Obstetrics and Gynecology

## 2019-03-22 ENCOUNTER — Inpatient Hospital Stay: Payer: BC Managed Care – PPO | Admitting: Anesthesiology

## 2019-03-22 ENCOUNTER — Encounter: Payer: Self-pay | Admitting: Certified Nurse Midwife

## 2019-03-22 DIAGNOSIS — Z302 Encounter for sterilization: Secondary | ICD-10-CM

## 2019-03-22 HISTORY — PX: TUBAL LIGATION: SHX77

## 2019-03-22 LAB — CBC
HCT: 35.1 % — ABNORMAL LOW (ref 36.0–46.0)
Hemoglobin: 11.4 g/dL — ABNORMAL LOW (ref 12.0–15.0)
MCH: 28.6 pg (ref 26.0–34.0)
MCHC: 32.5 g/dL (ref 30.0–36.0)
MCV: 88 fL (ref 80.0–100.0)
Platelets: 153 10*3/uL (ref 150–400)
RBC: 3.99 MIL/uL (ref 3.87–5.11)
RDW: 13.8 % (ref 11.5–15.5)
WBC: 11.9 10*3/uL — ABNORMAL HIGH (ref 4.0–10.5)
nRBC: 0 % (ref 0.0–0.2)

## 2019-03-22 LAB — RPR: RPR Ser Ql: NONREACTIVE

## 2019-03-22 LAB — FETAL SCREEN: Fetal Screen: NEGATIVE

## 2019-03-22 LAB — GLUCOSE, CAPILLARY: Glucose-Capillary: 122 mg/dL — ABNORMAL HIGH (ref 70–99)

## 2019-03-22 SURGERY — LIGATION, FALLOPIAN TUBE, POSTPARTUM
Anesthesia: General | Site: Abdomen | Laterality: Bilateral

## 2019-03-22 MED ORDER — PROPOFOL 10 MG/ML IV BOLUS
INTRAVENOUS | Status: DC | PRN
  Administered 2019-03-22: 200 mg via INTRAVENOUS

## 2019-03-22 MED ORDER — EPHEDRINE SULFATE 50 MG/ML IJ SOLN
INTRAMUSCULAR | Status: AC
  Filled 2019-03-22: qty 1

## 2019-03-22 MED ORDER — GLYCOPYRROLATE 0.2 MG/ML IJ SOLN
INTRAMUSCULAR | Status: DC | PRN
  Administered 2019-03-22: .4 mg via INTRAVENOUS

## 2019-03-22 MED ORDER — FENTANYL CITRATE (PF) 100 MCG/2ML IJ SOLN
INTRAMUSCULAR | Status: AC
  Filled 2019-03-22: qty 2

## 2019-03-22 MED ORDER — PROPOFOL 10 MG/ML IV BOLUS
INTRAVENOUS | Status: AC
  Filled 2019-03-22: qty 20

## 2019-03-22 MED ORDER — DEXAMETHASONE SODIUM PHOSPHATE 10 MG/ML IJ SOLN
INTRAMUSCULAR | Status: AC
  Filled 2019-03-22: qty 1

## 2019-03-22 MED ORDER — SUCCINYLCHOLINE CHLORIDE 20 MG/ML IJ SOLN
INTRAMUSCULAR | Status: DC | PRN
  Administered 2019-03-22: 120 mg via INTRAVENOUS

## 2019-03-22 MED ORDER — LIDOCAINE HCL (CARDIAC) PF 100 MG/5ML IV SOSY
PREFILLED_SYRINGE | INTRAVENOUS | Status: DC | PRN
  Administered 2019-03-22: 80 mg via INTRAVENOUS

## 2019-03-22 MED ORDER — SUCCINYLCHOLINE CHLORIDE 20 MG/ML IJ SOLN
INTRAMUSCULAR | Status: AC
  Filled 2019-03-22: qty 1

## 2019-03-22 MED ORDER — MIDAZOLAM HCL 2 MG/2ML IJ SOLN
INTRAMUSCULAR | Status: AC
  Filled 2019-03-22: qty 2

## 2019-03-22 MED ORDER — ROCURONIUM BROMIDE 100 MG/10ML IV SOLN
INTRAVENOUS | Status: AC
  Filled 2019-03-22: qty 1

## 2019-03-22 MED ORDER — ACETAMINOPHEN 325 MG PO TABS: 325.0000 mg | ORAL_TABLET | ORAL | Status: DC | PRN

## 2019-03-22 MED ORDER — LACTATED RINGERS IV SOLN
INTRAVENOUS | Status: DC | PRN
  Administered 2019-03-22: 15:00:00 via INTRAVENOUS

## 2019-03-22 MED ORDER — NEOSTIGMINE METHYLSULFATE 10 MG/10ML IV SOLN
INTRAVENOUS | Status: AC
  Filled 2019-03-22: qty 1

## 2019-03-22 MED ORDER — FENTANYL CITRATE (PF) 100 MCG/2ML IJ SOLN
25.0000 ug | INTRAMUSCULAR | Status: DC | PRN
  Administered 2019-03-22 (×3): 50 ug via INTRAVENOUS

## 2019-03-22 MED ORDER — MENTHOL 3 MG MT LOZG
1.0000 | LOZENGE | OROMUCOSAL | Status: DC | PRN
  Filled 2019-03-22: qty 9

## 2019-03-22 MED ORDER — FENTANYL CITRATE (PF) 100 MCG/2ML IJ SOLN
50.0000 ug | Freq: Once | INTRAMUSCULAR | Status: AC
  Administered 2019-03-22: 50 ug via INTRAVENOUS

## 2019-03-22 MED ORDER — MEPERIDINE HCL 50 MG/ML IJ SOLN: 6.2500 mg | INTRAMUSCULAR | Status: DC | PRN

## 2019-03-22 MED ORDER — BUPIVACAINE HCL (PF) 0.5 % IJ SOLN
INTRAMUSCULAR | Status: AC
  Filled 2019-03-22: qty 30

## 2019-03-22 MED ORDER — DEXTROSE-NACL 5-0.45 % IV SOLN
INTRAVENOUS | Status: DC
  Administered 2019-03-22: 01:00:00 via INTRAVENOUS

## 2019-03-22 MED ORDER — FENTANYL CITRATE (PF) 100 MCG/2ML IJ SOLN
INTRAMUSCULAR | Status: AC
Start: 2019-03-22 — End: 2019-03-23
  Filled 2019-03-22: qty 2

## 2019-03-22 MED ORDER — ONDANSETRON HCL 4 MG/2ML IJ SOLN
INTRAMUSCULAR | Status: DC | PRN
  Administered 2019-03-22: 4 mg via INTRAVENOUS

## 2019-03-22 MED ORDER — RHO D IMMUNE GLOBULIN 1500 UNIT/2ML IJ SOSY
300.0000 ug | PREFILLED_SYRINGE | Freq: Once | INTRAMUSCULAR | Status: AC
  Administered 2019-03-22: 300 ug via INTRAVENOUS
  Filled 2019-03-22: qty 2

## 2019-03-22 MED ORDER — HYDROCODONE-ACETAMINOPHEN 5-325 MG PO TABS
1.0000 | ORAL_TABLET | ORAL | Status: DC | PRN
  Administered 2019-03-22: 1 via ORAL
  Filled 2019-03-22: qty 1

## 2019-03-22 MED ORDER — NEOSTIGMINE METHYLSULFATE 10 MG/10ML IV SOLN
INTRAVENOUS | Status: DC | PRN
  Administered 2019-03-22: 4 mg via INTRAVENOUS

## 2019-03-22 MED ORDER — GLYCOPYRROLATE 0.2 MG/ML IJ SOLN
INTRAMUSCULAR | Status: AC
  Filled 2019-03-22: qty 2

## 2019-03-22 MED ORDER — DEXAMETHASONE SODIUM PHOSPHATE 10 MG/ML IJ SOLN
INTRAMUSCULAR | Status: DC | PRN
  Administered 2019-03-22: 8 mg via INTRAVENOUS

## 2019-03-22 MED ORDER — ACETAMINOPHEN 160 MG/5ML PO SOLN
325.0000 mg | ORAL | Status: DC | PRN
  Filled 2019-03-22: qty 20.3

## 2019-03-22 MED ORDER — BUPIVACAINE HCL 0.5 % IJ SOLN
INTRAMUSCULAR | Status: DC | PRN
  Administered 2019-03-22: 3 mL

## 2019-03-22 MED ORDER — LIDOCAINE HCL (PF) 2 % IJ SOLN
INTRAMUSCULAR | Status: AC
  Filled 2019-03-22: qty 10

## 2019-03-22 MED ORDER — PROMETHAZINE HCL 25 MG/ML IJ SOLN: 6.2500 mg | INTRAMUSCULAR | Status: DC | PRN

## 2019-03-22 MED ORDER — ONDANSETRON HCL 4 MG/2ML IJ SOLN
INTRAMUSCULAR | Status: AC
  Filled 2019-03-22: qty 2

## 2019-03-22 MED ORDER — HYDROCODONE-ACETAMINOPHEN 7.5-325 MG PO TABS
1.0000 | ORAL_TABLET | Freq: Once | ORAL | Status: DC | PRN
  Filled 2019-03-22: qty 1

## 2019-03-22 MED ORDER — ROCURONIUM BROMIDE 100 MG/10ML IV SOLN
INTRAVENOUS | Status: DC | PRN
  Administered 2019-03-22 (×2): 5 mg via INTRAVENOUS

## 2019-03-22 MED ORDER — LACTATED RINGERS IV SOLN
INTRAVENOUS | Status: DC
  Administered 2019-03-22: 19:00:00 via INTRAVENOUS

## 2019-03-22 MED ORDER — FENTANYL CITRATE (PF) 100 MCG/2ML IJ SOLN
INTRAMUSCULAR | Status: DC | PRN
  Administered 2019-03-22: 100 ug via INTRAVENOUS
  Administered 2019-03-22 (×2): 50 ug via INTRAVENOUS

## 2019-03-22 SURGICAL SUPPLY — 29 items
BLADE SURG SZ11 CARB STEEL (BLADE) ×3 IMPLANT
CHLORAPREP W/TINT 26 (MISCELLANEOUS) ×3 IMPLANT
COVER WAND RF STERILE (DRAPES) ×3 IMPLANT
DERMABOND ADVANCED (GAUZE/BANDAGES/DRESSINGS) ×2
DERMABOND ADVANCED .7 DNX12 (GAUZE/BANDAGES/DRESSINGS) ×1 IMPLANT
DRAPE LAPAROTOMY 77X122 PED (DRAPES) ×3 IMPLANT
ELECT CAUTERY BLADE 6.4 (BLADE) ×3 IMPLANT
ELECT REM PT RETURN 9FT ADLT (ELECTROSURGICAL) ×3
ELECTRODE REM PT RTRN 9FT ADLT (ELECTROSURGICAL) ×1 IMPLANT
GLOVE BIO SURGEON STRL SZ7 (GLOVE) ×3 IMPLANT
GLOVE BIOGEL PI IND STRL 7.5 (GLOVE) ×1 IMPLANT
GLOVE BIOGEL PI INDICATOR 7.5 (GLOVE) ×2
GOWN STRL REUS W/ TWL LRG LVL3 (GOWN DISPOSABLE) ×2 IMPLANT
GOWN STRL REUS W/ TWL XL LVL3 (GOWN DISPOSABLE) ×1 IMPLANT
GOWN STRL REUS W/TWL LRG LVL3 (GOWN DISPOSABLE) ×4
GOWN STRL REUS W/TWL XL LVL3 (GOWN DISPOSABLE) ×2
KIT TURNOVER CYSTO (KITS) ×3 IMPLANT
LABEL OR SOLS (LABEL) ×3 IMPLANT
NEEDLE HYPO 22GX1.5 SAFETY (NEEDLE) ×3 IMPLANT
NS IRRIG 500ML POUR BTL (IV SOLUTION) ×3 IMPLANT
PACK BASIN MINOR ARMC (MISCELLANEOUS) ×3 IMPLANT
SUT MNCRL 4-0 (SUTURE) ×2
SUT MNCRL 4-0 27XMFL (SUTURE) ×1
SUT PLAIN GUT 0 (SUTURE) ×6 IMPLANT
SUT VIC AB 2-0 UR6 27 (SUTURE) ×3 IMPLANT
SUT VIC AB 3-0 SH 27 (SUTURE) ×2
SUT VIC AB 3-0 SH 27X BRD (SUTURE) ×1 IMPLANT
SUTURE MNCRL 4-0 27XMF (SUTURE) ×1 IMPLANT
SYR 10ML LL (SYRINGE) ×3 IMPLANT

## 2019-03-22 NOTE — Anesthesia Procedure Notes (Signed)
Procedure Name: Intubation Performed by: Orion Crook, CRNA Pre-anesthesia Checklist: Patient identified, Emergency Drugs available, Suction available, Patient being monitored and Timeout performed Patient Re-evaluated:Patient Re-evaluated prior to induction Oxygen Delivery Method: Circle system utilized Preoxygenation: Pre-oxygenation with 100% oxygen Induction Type: IV induction, Rapid sequence and Cricoid Pressure applied Laryngoscope Size: Mac and 3 Grade View: Grade II Tube type: Oral Tube size: 7.0 mm Number of attempts: 1 Placement Confirmation: positive ETCO2,  CO2 detector and breath sounds checked- equal and bilateral Secured at: 22 cm Tube secured with: Tape

## 2019-03-22 NOTE — Progress Notes (Signed)
28 y.o. [id number]  with undesired fertility, desires permanent sterilization.  Other reversible forms of contraception were discussed with patient; she declines all other modalities. Permanent nature of as well as associated risks of the procedure discussed with patient including but not limited to: risk of regret, permanence of method, bleeding, infection, injury to surrounding organs and need for additional procedures.  Failure risk of 0.5-1% with  increased risk of ectopic gestation if pregnancy occurs was also discussed with patient.    The patient's history, vital signs reviewed, patient examined by me.  Her anatomy is amenable to a postpartum version of the BTL.    Her last food or drink was at around 8AM this morning.  She has been NPO since that time.  Prentice Docker, MD, [facility], [facility] 03/22/2019 2:05 PM

## 2019-03-22 NOTE — Progress Notes (Signed)
Post Partum Day 1. Patient interviewed with hospital interpretor Subjective: no complaints, voiding and tolerating PO. Bottle feeding Ate breakfast. Is now NPO for BTL later today. Currently scheduled for 6 PM. Very sure that she wants her tubes tied.  Objective: Blood pressure 100/65, pulse 80, temperature 98.4 F (36.9 C), temperature source Oral, resp. rate 18, height 5\' 3"  (1.6 m), weight 101.6 kg, last menstrual period 06/15/2018, SpO2 100 %, unknown if currently breastfeeding.  Physical Exam:  General: alert, cooperative and no distress Lochia: appropriate Uterine Fundus: firm/ U-3/ML/NT Incision: NA DVT Evaluation: No evidence of DVT seen on physical exam.  Recent Labs    03/21/19 1021 03/22/19 0535  HGB 11.9* 11.4*  HCT 36.4 35.1*  WBC 6.6 11.9*  PLT 160 153    Assessment/Plan: Stable PPD #1-continue postpartum care Pre-op BTL scheduled for 1800 tonight  NPO  Dr Glennon Mac will be in to discuss surgery with her A negative, Baby A positive-Rhogam candidate RI/VI TDAP given AP Bottle feeding  Dalia Heading, CNM   LOS: 1 day   Dalia Heading 03/22/2019, 10:49 AM

## 2019-03-22 NOTE — Op Note (Signed)
Operative Note Postpartum Tubal Ligation  Pre-Op Diagnosis: multiparity, desires permanent sterilization  Post-Op Diagnosis: multiparity, desires permanent sterilization  Procedures:  Postpartum tubal ligation via Pomeroy method  Primary Surgeon: Prentice Docker, MD  EBL: minimal  IVF: 600 mL   Specimens: portion of right and left fallopian tubes  Drains: None  Complications: None   Disposition: PACU   Condition: Stable   Findings: normal appearing bilateral fallopian tubes  Indication: The patient is a 28 y.o. [id number] who is postpartum day 1 status post spontaneous vaginal delivery.  She has been counseled extensively regarding risks, benefits, and alternatives to tubal ligation, including non-permanent forms of contraception that are equivalent in efficacy with potentially better side effects.  She has been advised that there is a failure rate of 3-5 in every 1,000 tubal ligations per year with an increased risk of ectopic pregnancy should pregnancy occur.   Procedure Summary:  The patient was taken to the operating room where general anesthesia was administered and found to be adequate. After timeout was called a small transverse, infraumbilical incision was made with the scalpel. The incision was carried down through the fascia until the peritoneum was identified and entered. The peritoneum was noted to be free of any adhesions and the incision was then extended.  The patient's left fallopian tube was identified, brought incision, and grasped with a Babcock clamp. The tube was then followed out to the fimbria. The Babcock clamp was then used to grasp the tube approximately 4 cm from the cornual region. A 3 cm segment of tube was then ligated with the 2 free ties of plain gut, and excised. Good hemostasis was noted and the tube was returned to the abdomen. The right fallopian tube was then ligated, and a 3 cm segment excised in a similar fashion. Excellent hemostasis was noted,  and the tube returned to the abdomen.  The peritoneum and fascia were closed in a single layer using 2-0 Vicryl. The subcutaneous tissue was reapproximated such that no greater than 2 cm of dead space remained.  The skin was closed in a subcuticular fashion using 4-0 monocryl. The closure was also closed with Dermabond.  Sponge, lap, needle, and instrument counts were correct x 2.  VTE prophylaxis: SCDs. Antibiotic prophylaxis: none indicated. The patient tolerated the procedure well and was taken to the PACU in stable condition.   Prentice Docker, MD 03/22/2019 4:55 PM

## 2019-03-22 NOTE — Transfer of Care (Signed)
Immediate Anesthesia Transfer of Care Note  Patient: Cheyenne Gomez  Procedure(s) Performed: POST PARTUM TUBAL LIGATION (Bilateral Abdomen)  Patient Location: PACU  Anesthesia Type:General  Level of Consciousness: drowsy  Airway & Oxygen Therapy: Patient Spontanous Breathing  Post-op Assessment: Report given to RN  Post vital signs: stable  Last Vitals:  Vitals Value Taken Time  BP 118/73 03/22/19 1700  Temp 36.6 C 03/22/19 1659  Pulse 72 03/22/19 1700  Resp 22 03/22/19 1700  SpO2 100 % 03/22/19 1700  Vitals shown include unvalidated device data.  Last Pain:  Vitals:   03/22/19 0723  TempSrc: Oral  PainSc:          Complications: No apparent anesthesia complications

## 2019-03-22 NOTE — Progress Notes (Signed)
Spoke with MD, surgery may be done earlier, depending on OR schedule. Breakfast tray was removed from the room at 0815 this morning. Interpreter at bedside, explained to patient this is very important to remain NPO for pending surgery. Pt agreeable, no food/drink at bedside, IV fluids currently infusing at 56mL/hr. Dr. Glennon Mac to round later this AM.

## 2019-03-22 NOTE — Lactation Note (Signed)
This note was copied from a baby's chart. Lactation Consultation Note  Patient Name: Cheyenne Gomez [id number] Date: 03/22/2019 Reason for consult: Follow-up assessment;Term;Other (Comment)(Needed Spanish interpreter)  Martin Majestic to speak with mom with [place], Caryn Bee, to discuss her giving large volumes of formula.  Mom reports mostly breastfeeding with others for 1 year or more, but did given some formula.  Discussed newborn stomach size, feeding cues and supply and demand and encouraged frequent breast feeding whenever he demonstrated feeding cues to bring in mature milk and ensure a plentiful milk supply.  Reviewed normal course of lactation and routine newborn feeding patterns.  Mom is going for BTL later.  Encouraged mom to just breast feed until surgery.  Answered any lactation questions mom had through [employer].  Mom reports nipple getting tender.  Demonstrated how to hand express and how and why to rub colostrum on nipples.  Coconut oil and instructed in use.  Lactation name and number written on white board and encouraged to call with any questions, concerns or assistance.   Maternal Data Formula Feeding for Exclusion: No Has patient been taught Hand Expression?: Yes Does the patient have breastfeeding experience prior to this delivery?: Yes  Feeding Feeding Type: Breast Fed  LATCH Score                   Interventions Interventions: Breast feeding basics reviewed  Lactation Tools Discussed/Used WIC Program: [employer])   Consult Status Consult Status: Follow-up(Mom going for BTL later) Date: 03/22/19 Follow-up type: Call as needed    Jarold Motto 03/22/2019, 10:03 AM

## 2019-03-22 NOTE — Anesthesia Preprocedure Evaluation (Signed)
Anesthesia Evaluation  Patient identified by MRN, date of birth, ID band Patient awake  General Assessment Comment:Pt ate breakfast at 0800, will delay surgery to 1600. Discussed w surgeon/pt/staff  Reviewed: Allergy & Precautions, H&P , NPO status , Patient's Chart, lab work & pertinent test results, reviewed documented beta blocker date and time   Airway Mallampati: III  TM Distance: >3 FB Neck ROM: full    Dental  (+) Chipped   Pulmonary    Pulmonary exam normal        Cardiovascular Normal cardiovascular exam     Neuro/Psych    GI/Hepatic   Endo/Other  diabetes  Renal/GU      Musculoskeletal   Abdominal   Peds  Hematology   Anesthesia Other Findings Past Medical History: No date: Calculus of gallbladder with biliary obstruction but without  cholecystitis No date: Gestational diabetes 2020: Pyelonephritis Past Surgical History: No date: NO PAST SURGERIES BMI    Body Mass Index: 39.68 kg/m     Reproductive/Obstetrics                             Anesthesia Physical Anesthesia Plan  ASA: II and emergent  Anesthesia Plan: General ETT and Modified Rapid Sequence   Post-op Pain Management:    Induction: Intravenous  PONV Risk Score and Plan: 3 and Ondansetron and Treatment may vary due to age or medical condition  Airway Management Planned: Oral ETT  Additional Equipment:   Intra-op Plan:   Post-operative Plan: Extubation in OR  Informed Consent: I have reviewed the patients History and Physical, chart, labs and discussed the procedure including the risks, benefits and alternatives for the proposed anesthesia with the patient or authorized representative who has indicated his/her understanding and acceptance.     Dental Advisory Given  Plan Discussed with: CRNA  Anesthesia Plan Comments:         Anesthesia Quick Evaluation

## 2019-03-22 NOTE — Anesthesia Post-op Follow-up Note (Signed)
Anesthesia QCDR form completed.        

## 2019-03-23 ENCOUNTER — Other Ambulatory Visit: Payer: Self-pay | Admitting: Certified Nurse Midwife

## 2019-03-23 DIAGNOSIS — O24415 Gestational diabetes mellitus in pregnancy, controlled by oral hypoglycemic drugs: Secondary | ICD-10-CM

## 2019-03-23 LAB — RHOGAM INJECTION: Unit division: 0

## 2019-03-23 MED ORDER — NITROFURANTOIN MONOHYD MACRO 100 MG PO CAPS: 100.0000 mg | ORAL_CAPSULE | Freq: Every day | ORAL | Status: AC

## 2019-03-23 MED ORDER — IBUPROFEN 600 MG PO TABS: 600.0000 mg | ORAL_TABLET | Freq: Four times a day (QID) | ORAL | 0 refills | Status: DC | PRN

## 2019-03-23 MED ORDER — OXYCODONE HCL 5 MG PO TABS: 5.0000 mg | ORAL_TABLET | Freq: Four times a day (QID) | ORAL | 0 refills | Status: AC | PRN

## 2019-03-23 MED ORDER — ACETAMINOPHEN 325 MG PO TABS: 650.0000 mg | ORAL_TABLET | ORAL | Status: DC | PRN

## 2019-03-23 NOTE — Anesthesia Postprocedure Evaluation (Signed)
Anesthesia Post Note  Patient: Cheyenne Gomez  Procedure(s) Performed: POST PARTUM TUBAL LIGATION (Bilateral Abdomen)  Patient location during evaluation: PACU Anesthesia Type: General Level of consciousness: awake and alert Pain management: pain level controlled Vital Signs Assessment: post-procedure vital signs reviewed and stable Respiratory status: spontaneous breathing, nonlabored ventilation, respiratory function stable and patient connected to nasal cannula oxygen Cardiovascular status: blood pressure returned to baseline and stable Postop Assessment: no apparent nausea or vomiting Anesthetic complications: no     Last Vitals:  Vitals:   03/23/19 0319 03/23/19 0830  BP: 99/65 99/70  Pulse: (!) 59 (!) 54  Resp: 20 18  Temp: 36.5 C 36.7 C  SpO2: 97% 98%    Last Pain:  Vitals:   03/23/19 0830  TempSrc: Oral  PainSc:                  Alphonsus Sias

## 2019-03-23 NOTE — Discharge Instructions (Signed)
Ligadura de trompas posparto, cuidados posteriores Postpartum Tubal Ligation, Care After Esta hoja le brinda informacin sobre cmo cuidarse despus del procedimiento. El mdico tambin podr darle instrucciones ms especficas. Comunquese con el mdico si tiene problemas o preguntas. Qu puedo esperar despus del procedimiento? Despus del procedimiento, puede tener lo siguiente:  Dolor de Advertising copywritergarganta.  Moretones o Radiographer, therapeuticdolor en la espalda.  Nuseas o vmitos.  Mareos.  Dolor o molestias abdominales leves, como clicos, dolor por gases o sensacin de hinchazn.  Dolor alrededor del rea de la incisin.  Cansancio.  Dolor en los hombros. Siga estas instrucciones en su casa: Medicamentos  Pregntele al mdico si el medicamento recetado: ? Hace que sea necesario que evite conducir o usar maquinaria pesada. ? Puede causarle estreimiento. Es posible que deba tomar medidas para prevenir o tratar el estreimiento, por ejemplo:  Beber suficiente lquido como para Pharmacologistmantener la orina de color amarillo plido.  Tomar medicamentos recetados o de H. J. Heinzventa libre.  Consumir alimentos ricos en fibra, como frijoles, cereales integrales, y frutas y verduras frescas.  Limitar el consumo de alimentos ricos en grasa y azcares procesados, como los alimentos fritos o dulces.  No tome aspirina, ya que puede causar hemorragias. Actividad  Haga reposo por el resto del da.  Retome sus actividades normales segn lo indicado por el mdico. Pregntele al mdico qu actividades son seguras para usted.  No mantenga relaciones sexuales, no se haga duchas vaginales ni se coloque un tampn o cualquier otro objeto en la vagina durante 6semanas o el tiempo que le haya indicado el mdico.  No levante ningn objeto que sea ms pesado que su beb, o el lmite de peso que le hayan indicado, durante 2semanas o hasta que el mdico le diga que puede [place]. Cuidado de la incisin      Siga las instrucciones del  mdico acerca del cuidado de la incisin. Asegrese de hacer lo siguiente: ? Lvese las manos con agua y [place] antes y despus de cambiar la venda (vendaje). Use desinfectante para manos si no dispone de [place] y [place]. ? Cambie el vendaje como se lo haya indicado el mdico. ? No retire los puntos (suturas), la goma para cerrar la piel o las tiras [place]ivas. Es posible que estos cierres cutneos deban quedar puestos en la piel durante 2semanas o ms tiempo. Si los bordes de las tiras [address]ad[address]ivas empiezan a despegarse y Scientific laboratory technicianenroscarse, puede recortar los que estn sueltos. No retire las tiras Agilent Technologiesadhesivas por completo a menos que el mdico se lo indique.  Controle todos los das la zona de la incisin para detectar signos de infeccin. Est atento a los siguientes signos: ? Enrojecimiento, hinchazn o dolor. ? Lquido o sangre. ? Calor. ? Pus o mal olor. Otras instrucciones  No tome baos de inmersin, no nade ni use el jacuzzi hasta que el mdico lo autorice. Pregntele al mdico si puede ducharse. Delle Reiningal vez solo le permitan darse baos de Deenwoodesponja.  Concurra a todas las visitas de [address]seguimiento como se lo haya indicado el mdico. Esto es importante. Comunquese con un mdico si:  Tiene enrojecimiento, hinchazn o dolor alrededor de la incisin.  Su incisin se siente caliente al tacto.  El dolor no mejora despus de 2a 3das.  Tiene una erupcin cutnea.  Tiene mareos o sensacin de desvanecimiento de forma repetida.  El medicamento no IT trainerle calma el dolor.  Tiene estreimiento. Solicite ayuda inmediatamente si:  Tiene fiebre.  Se desmaya.  Tiene dolor abdominal que empeora.  Presenta lquido o  sangre proveniente de la incisin.  Tiene pus o percibe que sale mal olor de su incisin.  Se le abren los bordes de la incisin despus de que le hayan sacado las suturas.  Le falta el aire o tiene dificultad para respirar.  Siente dolor en el pecho o en las piernas.  Tiene nuseas o diarrea  de forma continua. Resumen  Es comn sentir molestias abdominales leves despus de este procedimiento.  Comunquese con su mdico si presenta problemas o tiene alguna preocupacin.  No levante ningn objeto que sea ms pesado que su beb, o el lmite de peso que le hayan indicado, durante 2semanas o hasta que el mdico le diga que puede [place].  Concurra a todas las visitas de seguimiento como se lo haya indicado el mdico. Esto es importante. Esta informacin no tiene Marine scientist el consejo del mdico. Asegrese de hacerle al mdico cualquier pregunta que tenga. Document Released: 02/20/2012 Document Revised: 09/09/2018 Document Reviewed: 09/09/2018 Elsevier Patient Education  [place].

## 2019-03-23 NOTE — Progress Notes (Signed)
Reviewed D/C instructions with pt and family. Pt verbalized understanding of teaching. Discharged to home via W/C. Pt to schedule f/u appt.  

## 2019-03-24 ENCOUNTER — Encounter: Payer: Self-pay | Admitting: Obstetrics and Gynecology

## 2019-03-25 LAB — SURGICAL PATHOLOGY

## 2019-03-30 ENCOUNTER — Encounter: Payer: Self-pay | Admitting: Certified Nurse Midwife

## 2019-03-30 NOTE — Progress Notes (Signed)
ROB at [id number]: GDMA2 taking 5 mgm glyburide in AM and 2.5 mgm in PM.  CBG log: FBS normal range 73-83; 2hr pp after breakfast: 80-135 with 3/7 >120, 2hr pp after lunch: 91-121, with 1/6 >120, 2hr pp after supper: 94-120, with only 1/7 at 120. Hemoglobin A1C 6/29 was 5.8% Baby active. Last growth scan 03/03/2019 EFW 7#11oz or 92%. NST today is reactive with a baseline of 150 and accelerations to 170s to 180, moderate variability Scheduled for an IOL 03/21/19. Discussed IOL procedure and the use of Cytotec, foley bulb, AROM and Pitocin. Questions answered.   Dalia Heading, CNM

## 2019-04-30 ENCOUNTER — Other Ambulatory Visit: Payer: Self-pay

## 2019-04-30 DIAGNOSIS — Z20822 Contact with and (suspected) exposure to covid-19: Secondary | ICD-10-CM

## 2019-05-01 ENCOUNTER — Ambulatory Visit: Payer: Self-pay | Admitting: *Deleted

## 2019-05-01 ENCOUNTER — Ambulatory Visit: Payer: BC Managed Care – PPO | Admitting: Advanced Practice Midwife

## 2019-05-01 ENCOUNTER — Other Ambulatory Visit: Payer: BC Managed Care – PPO

## 2019-05-01 LAB — NOVEL CORONAVIRUS, NAA: SARS-CoV-2, NAA: DETECTED — AB

## 2019-05-01 NOTE — Telephone Encounter (Signed)
Pt called in for COVID-19 result and informed it was positive.    See the Result Note.   Went over the isolation protocol, cleaning, wearing a mask, etc.

## 2019-05-04 IMAGING — US US ABDOMEN LIMITED
1 series · 14 of 25 positions shown · non-contrast
Comparison: 06/07/2017.

CLINICAL DATA: Epigastric pain.

EXAM:
ULTRASOUND ABDOMEN LIMITED RIGHT UPPER QUADRANT

[Series 1: us abdomen limited · 0.26mm/px · 14 of 44 slices shown]
[im 1/44]
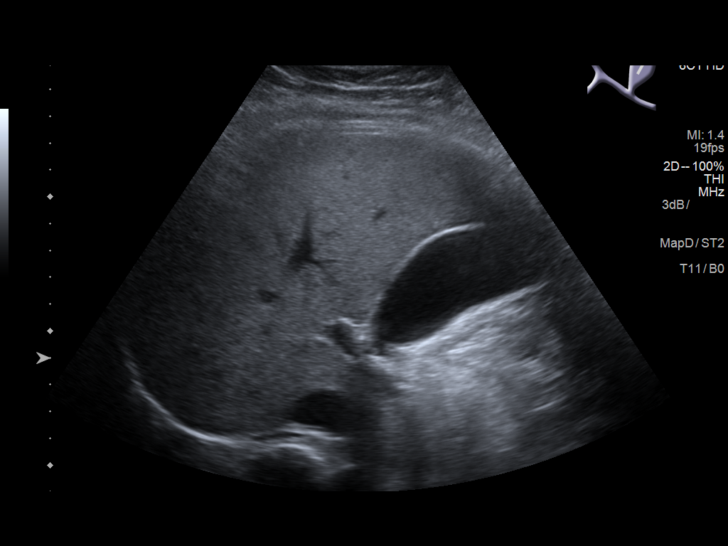
[im 4/44]
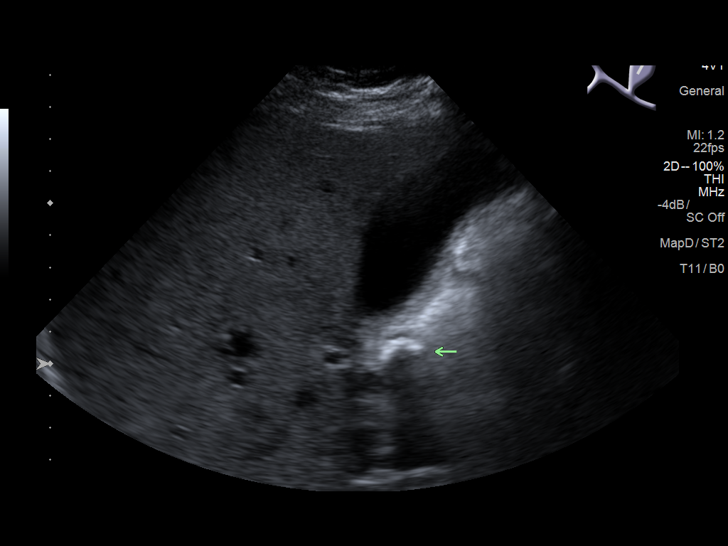
[im 8/44]
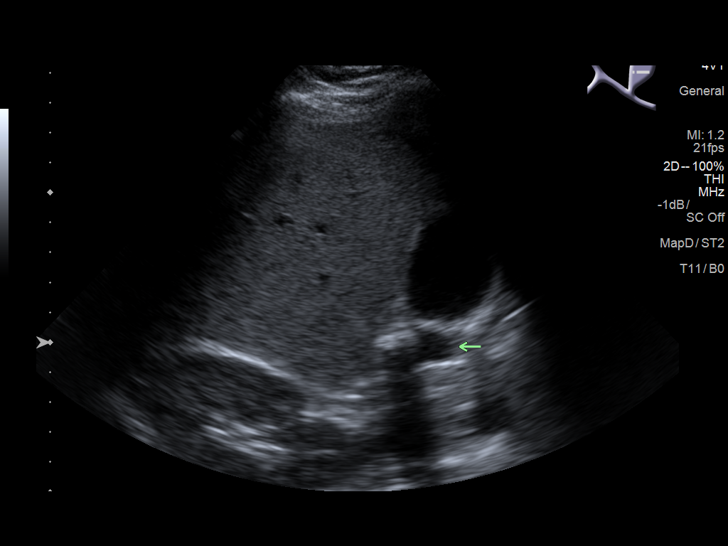
[im 11/44]
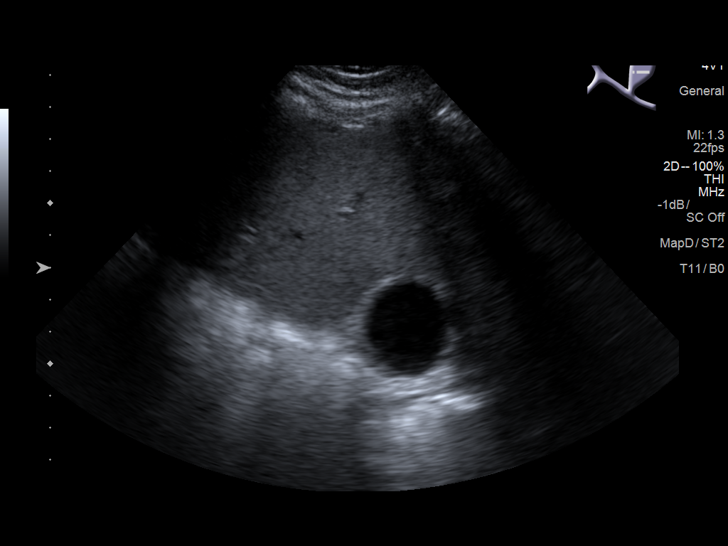
[im 15/44]
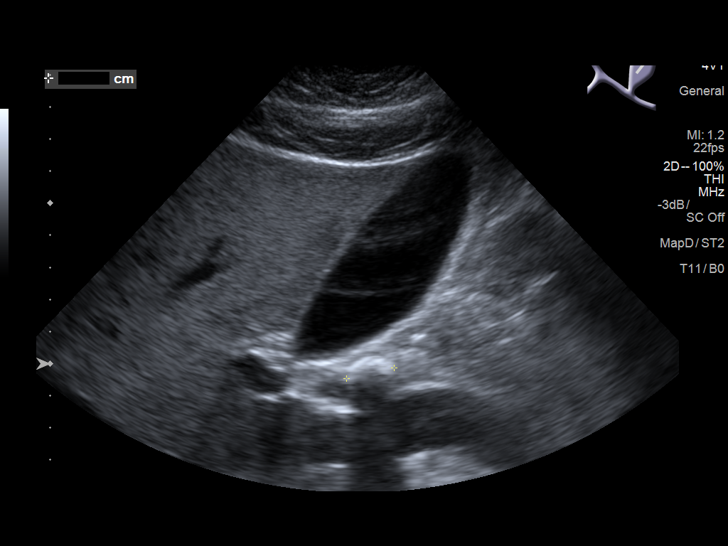
[im 17/44]
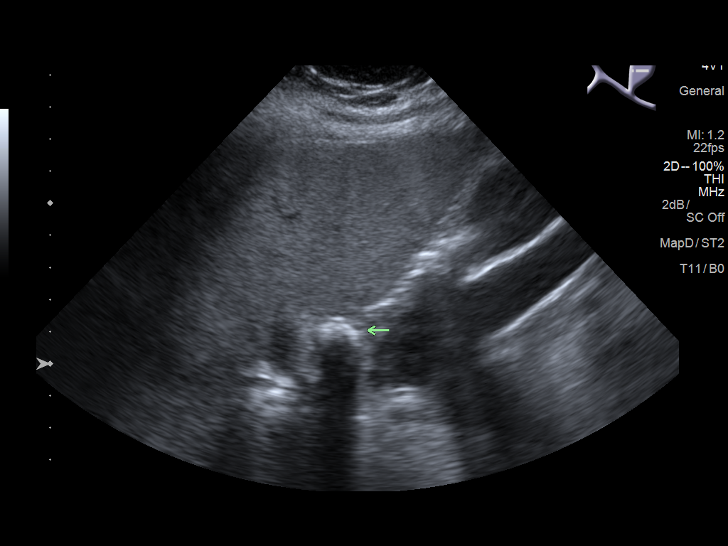
[im 20/44]
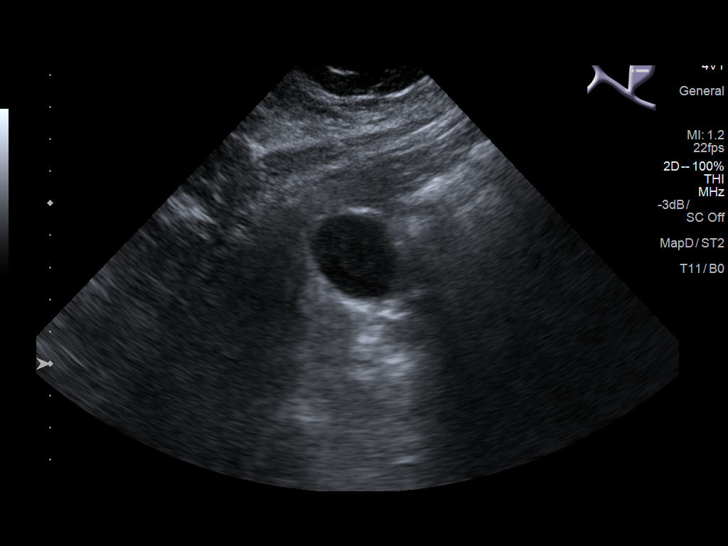
[im 24/44]
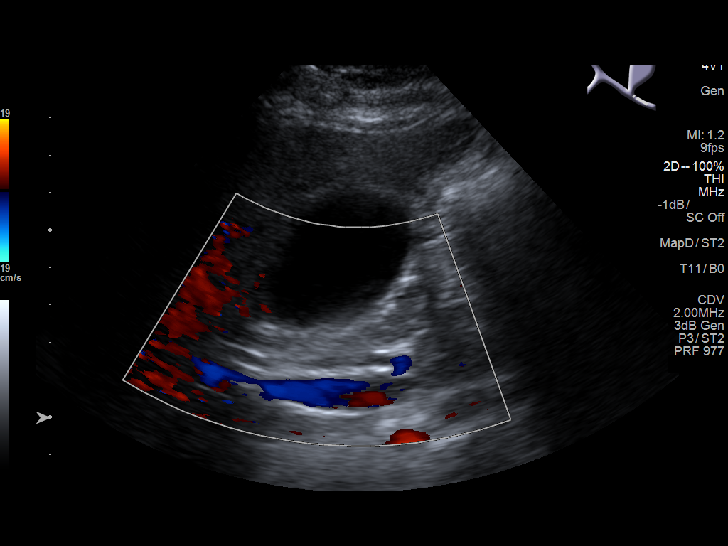
[im 27/44]
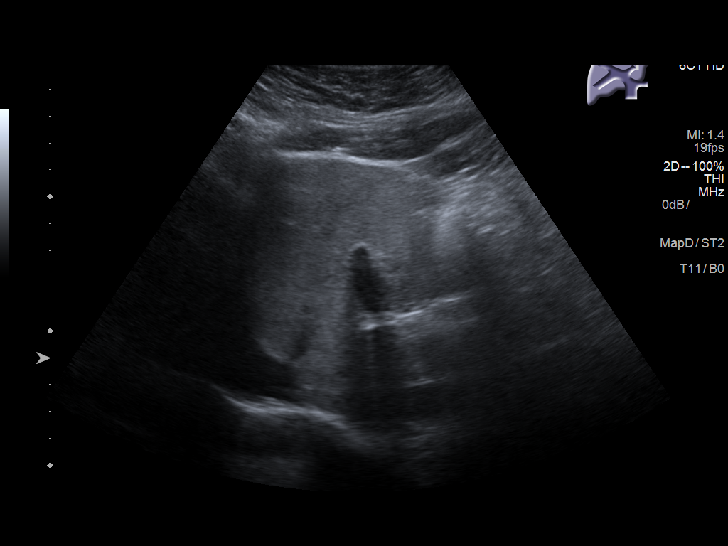
[im 29/44]
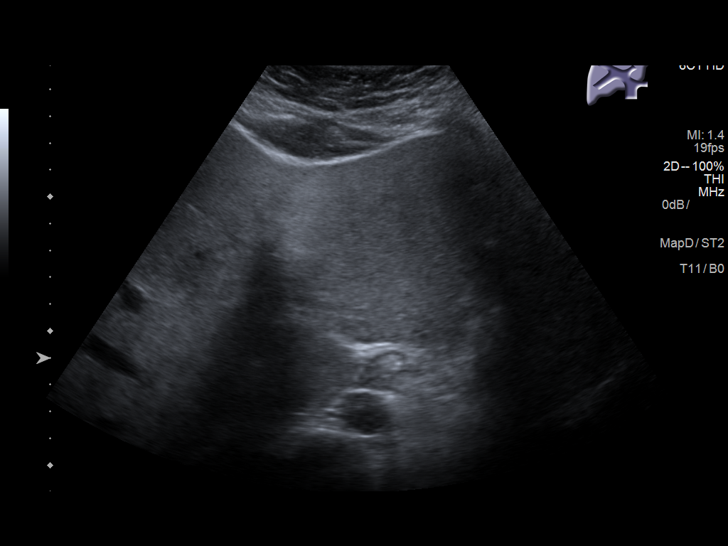
[im 33/44]
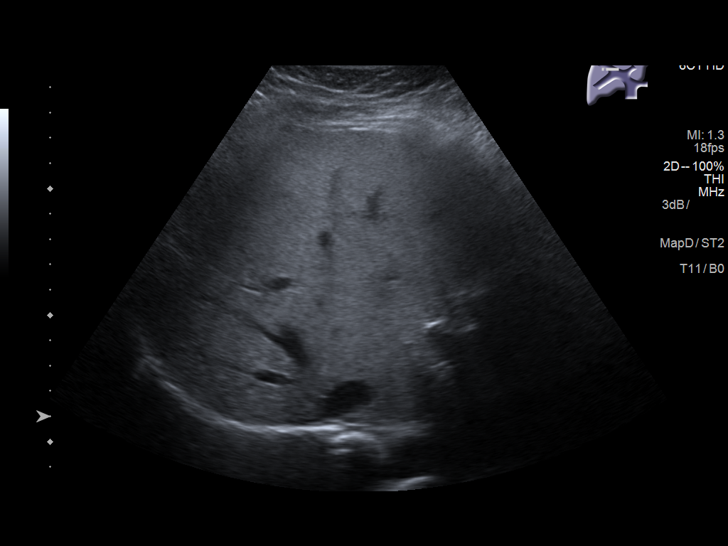
[im 36/44]
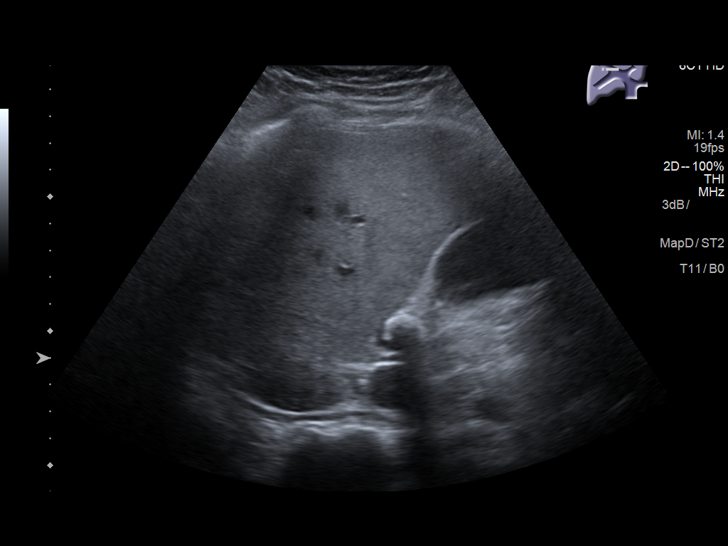
[im 40/44]
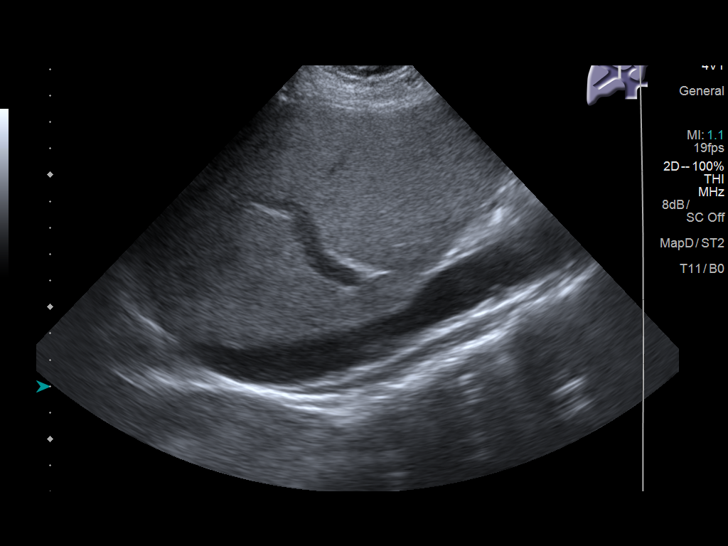
[im 44/44]
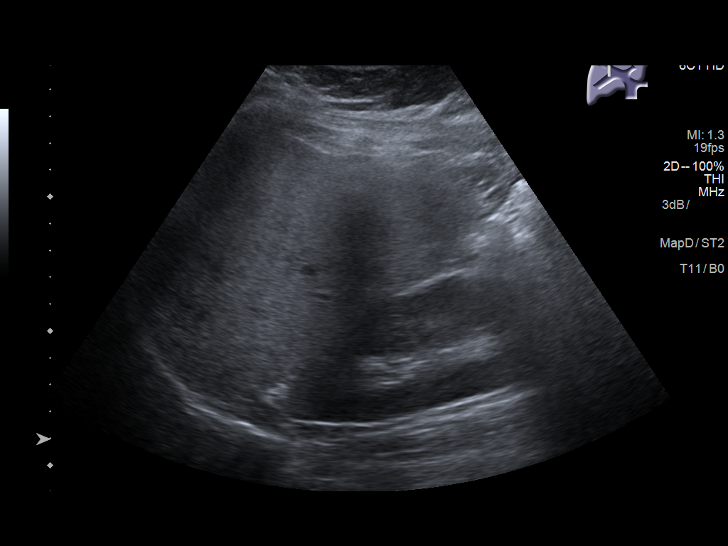

[14 of 25 positions shown; findings below may reference images not displayed]

FINDINGS: Gallbladder:

1.7 cm stone noted lodged at the gallbladder neck. Gallbladder wall
thickness 2.7 mm. Murphy sign could not be evaluated due to patient
being medicated. No pericholecystic fluid collections.

Common bile duct:

Diameter: 2.7 mm

Liver:

Mild increased echogenicity consistent fatty infiltration or
hepatocellular disease. Portal vein is patent on color Doppler
imaging with normal direction of blood flow towards the liver.
IMPRESSION: 1. 1.7 cm stone noted lodged in the gallbladder neck. No prominent
gallbladder wall thickening. No pericholecystic fluid collections.
No biliary distention.

2. Increased hepatic echogenicity 6 consistent fatty infiltration or
hepatocellular disease.

## 2019-05-04 IMAGING — US US OB < 14 WEEKS - US OB TV
1 series · 13 of 28 positions shown · non-contrast
Comparison: None.

CLINICAL DATA: Intrauterine device present. Positive pregnancy
test.

EXAM:
OBSTETRIC <14 WK US AND TRANSVAGINAL OB US
TECHNIQUE: Both transabdominal and transvaginal ultrasound examinations were
performed for complete evaluation of the gestation as well as the
maternal uterus, adnexal regions, and pelvic cul-de-sac.
Transvaginal technique was performed to assess early pregnancy.

[Series 1: us ob < 14 weeks - us ob tv · 0.17mm/px · 13 of 108 slices shown]
[im 4/108]
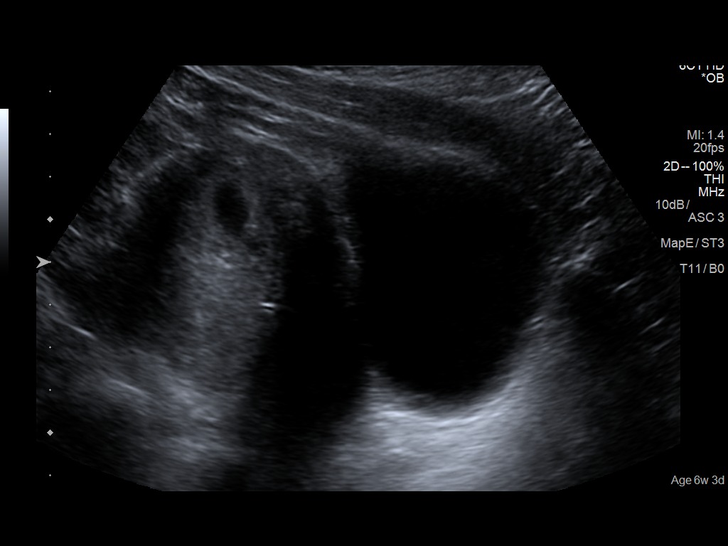
[im 12/108]
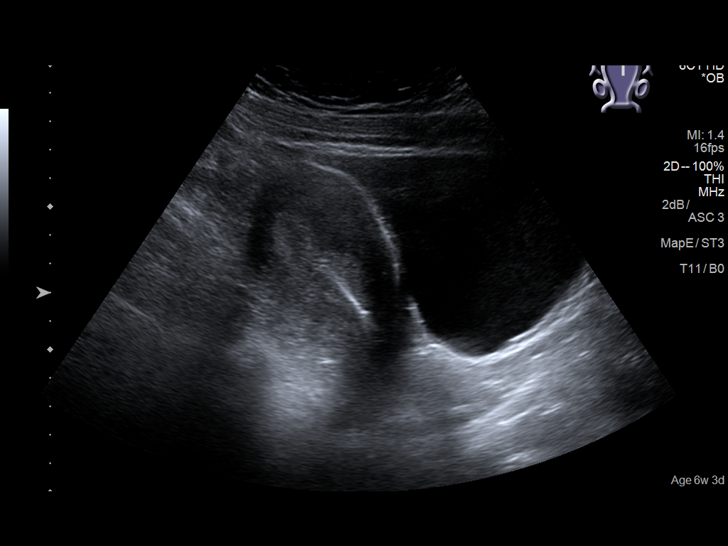
[im 20/108]
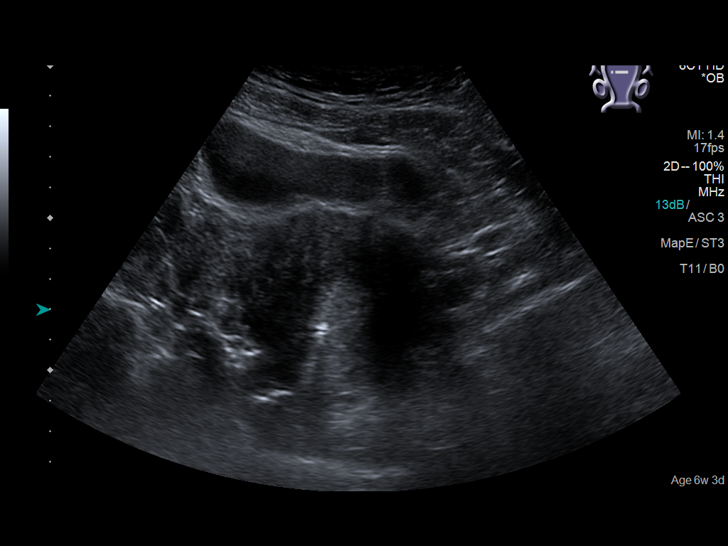
[im 28/108]
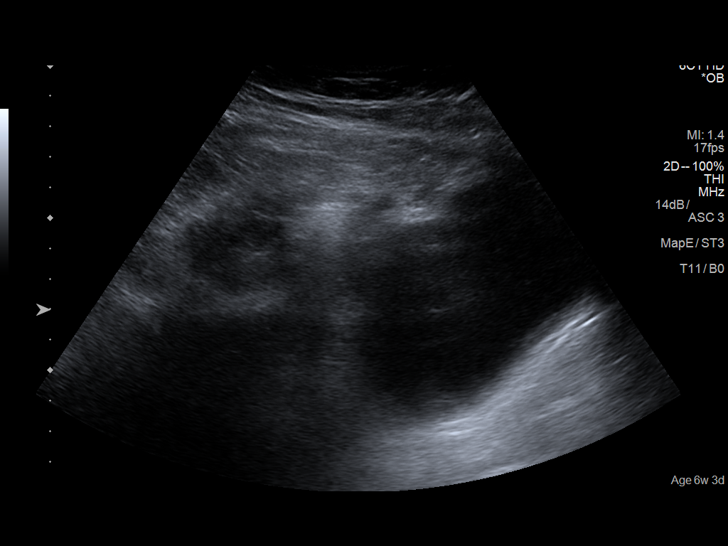
[im 36/108]
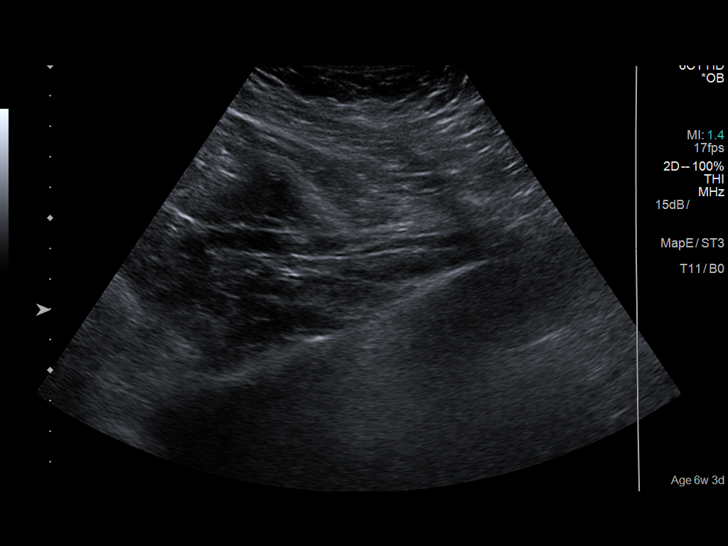
[im 44/108]
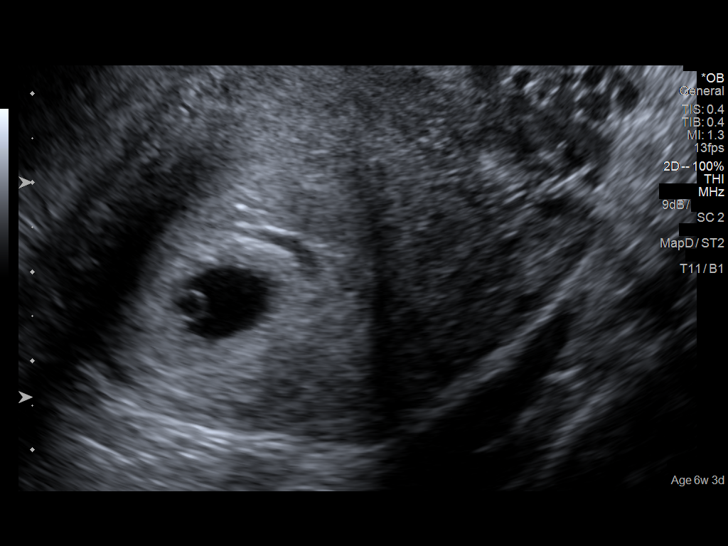
[im 56/108]
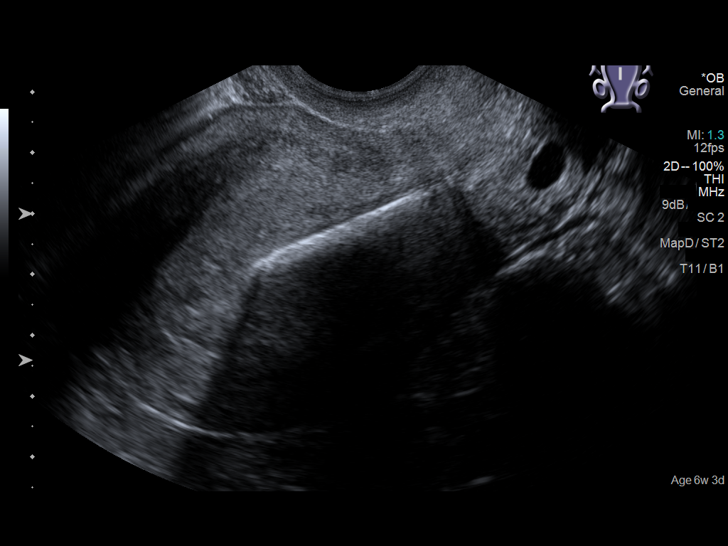
[im 64/108]
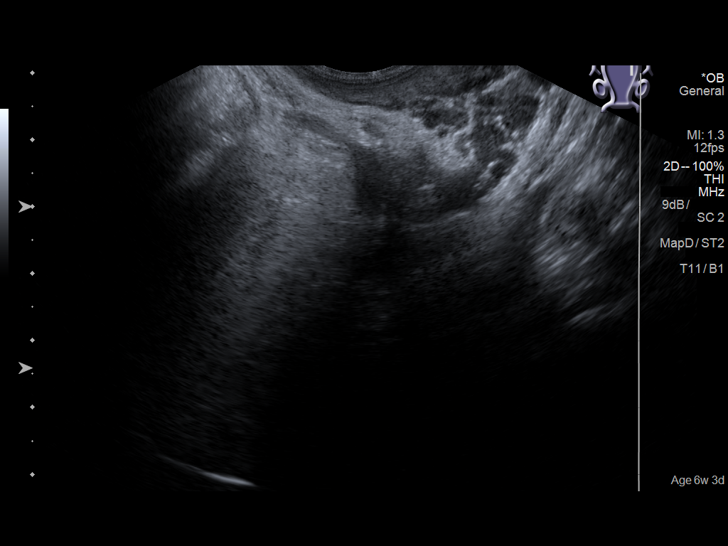
[im 72/108]
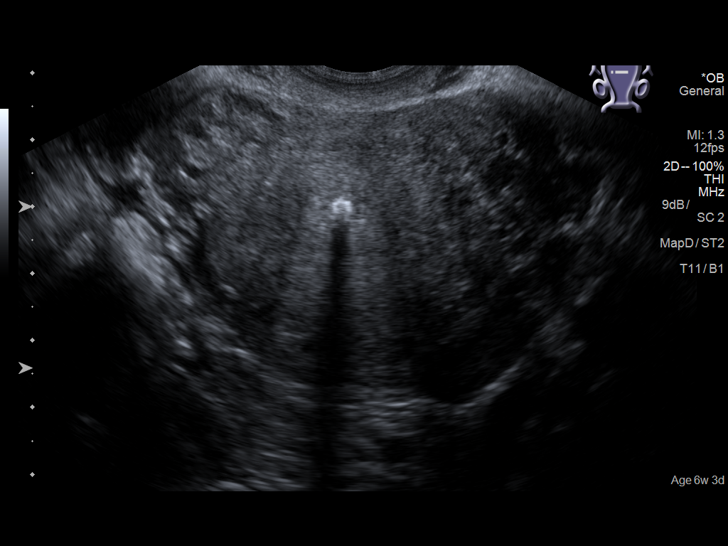
[im 80/108]
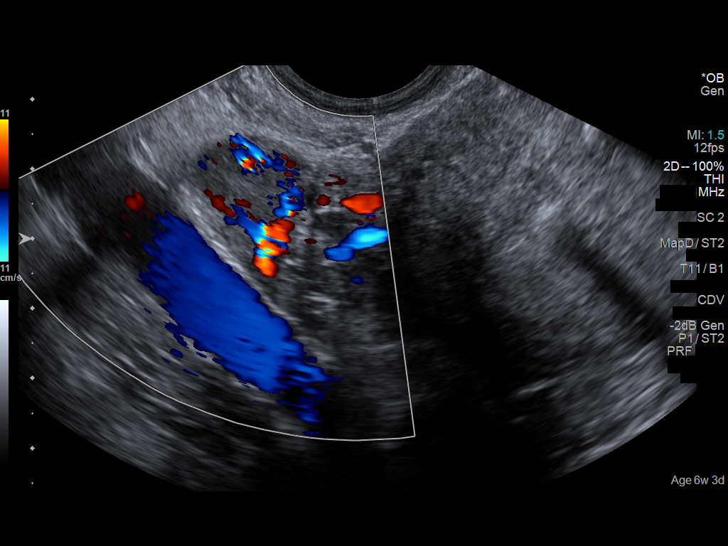
[im 88/108]
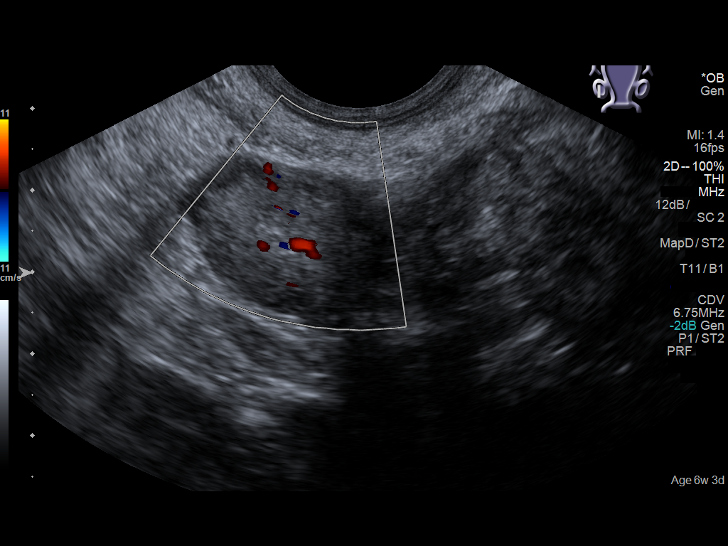
[im 96/108]
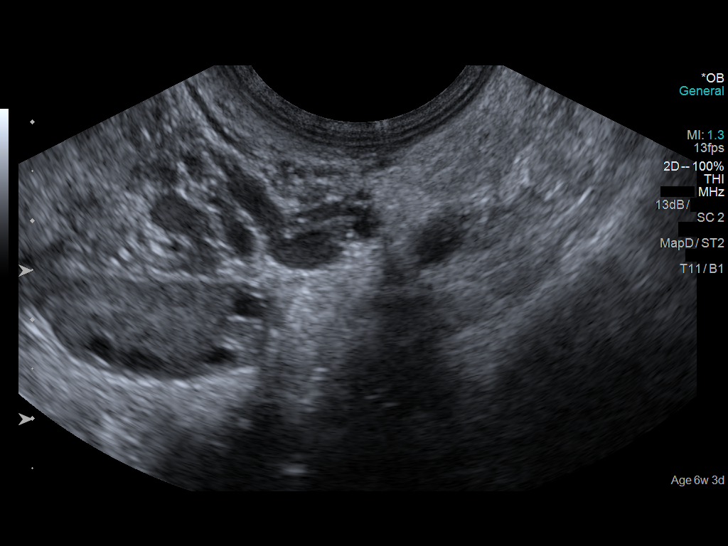
[im 104/108]
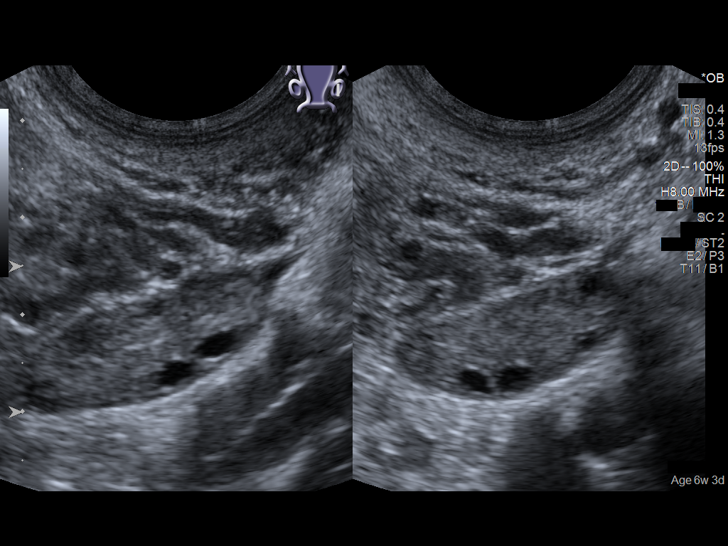

[13 of 28 positions shown; findings below may reference images not displayed]

FINDINGS: Intrauterine gestational sac: Visualized

Yolk sac:  Visualized

Embryo:  Not visualized

Cardiac Activity: Not visualized

MSD: 9 mm   5 w 5 d

Subchorionic hemorrhage:  None visualized.

Maternal uterus/adnexae: Intrauterine device is positioned within
the endometrium. The gestational sac is slightly lateral to the IUD
toward the left. Cervical os is closed. Right ovary measures 3.0 x
2.5 x 2.5 with a measured volume of 9.6 cm. Left ovary measures
x 1.0 x 2.3 cm with a measured volume of 3.3 cm. No extrauterine
pelvic or adnexal mass. No free pelvic fluid.
IMPRESSION: There is a gestational sac slightly lateral to an intrauterine
device positioned within the endometrium. Yolk sac is seen within
the gestational sac. Fetal pole currently not yet seen. Based on
gestational sac diameter, estimated gestational age is 6-weeks.
Would advise follow-up ultrasound in approximately 14 days to assess
for fetal pole and fetal heart activity.

Cervical os closed.  No extrauterine mass or free fluid evident.

## 2019-05-08 ENCOUNTER — Ambulatory Visit (INDEPENDENT_AMBULATORY_CARE_PROVIDER_SITE_OTHER): Payer: BC Managed Care – PPO | Admitting: Advanced Practice Midwife

## 2019-05-08 ENCOUNTER — Encounter: Payer: Self-pay | Admitting: Advanced Practice Midwife

## 2019-05-08 ENCOUNTER — Other Ambulatory Visit: Payer: BC Managed Care – PPO

## 2019-05-08 ENCOUNTER — Other Ambulatory Visit: Payer: Self-pay

## 2019-05-08 DIAGNOSIS — Z1389 Encounter for screening for other disorder: Secondary | ICD-10-CM | POA: Diagnosis not present

## 2019-05-08 DIAGNOSIS — O24415 Gestational diabetes mellitus in pregnancy, controlled by oral hypoglycemic drugs: Secondary | ICD-10-CM

## 2019-05-08 NOTE — Progress Notes (Signed)
Postpartum Visit  Chief Complaint:  Chief Complaint  Patient presents with  . Postpartum Care    History of Present Illness: Patient is a 28 y.o. [id number] presents for postpartum visit.  She completed 2 hour gtt this morning.  Review the Delivery Report for details.  Date of delivery: 03/21/2019 Type of delivery: Vaginal delivery - Vacuum or forceps assisted  no Episiotomy No.  Laceration: no  Pregnancy or labor problems:  Gestational diabetes Any problems since the delivery:  no  Newborn Details:  SINGLETON :  1. BabyGender female. Birth weight: 9 pounds 3 ounces Maternal Details:  Breast or formula feeding: breastfeeding Intercourse: No  Contraception after delivery: tubal ligation Any bowel or bladder issues: No  Post partum depression/anxiety noted:  no [place] Post-Partum Depression Score: 1 Date of last PAP: 1 year ago  no abnormalities   Review of Systems: Review of Systems  Constitutional: Negative.   HENT: Negative.   Eyes: Negative.   Respiratory: Negative.   Cardiovascular: Negative.   Gastrointestinal: Negative.   Genitourinary: Negative.   Musculoskeletal: Negative.   Skin: Negative.   Neurological: Negative.   Endo/Heme/Allergies: Negative.   Psychiatric/Behavioral: Negative.     Past Medical History:  Past Medical History:  Diagnosis Date  . Calculus of gallbladder with biliary obstruction but without cholecystitis   . Gestational diabetes   . Pyelonephritis 2020    Past Surgical History:  Past Surgical History:  Procedure Laterality Date  . NO PAST SURGERIES    . TUBAL LIGATION Bilateral 03/22/2019   Procedure: POST PARTUM TUBAL LIGATION;  Surgeon: Will Bonnet, MD;  Location: [facility] ORS;  Service: Gynecology;  Laterality: Bilateral;    Family History:  Family History  Problem Relation Age of Onset  . Diabetes Maternal Grandfather     Social History:  Social History   Socioeconomic History  . Marital status: Single   Spouse name: Danny  . Number of children: Not on file  . Years of education: Not on file  . Highest education level: Not on file  Occupational History  . Not on file  Social Needs  . Financial resource strain: Not on file  . Food insecurity    Worry: Not on file    Inability: Not on file  . Transportation needs    Medical: Not on file    Non-medical: Not on file  Tobacco Use  . Smoking status: Never Smoker  . Smokeless tobacco: Never Used  Substance and Sexual Activity  . Alcohol use: No  . Drug use: No  . Sexual activity: Yes  Lifestyle  . Physical activity    Days per week: Not on file    Minutes per session: Not on file  . Stress: Not on file  Relationships  . Social Herbalist on phone: Not on file    Gets together: Not on file    Attends religious service: Not on file    Active member of club or organization: Not on file    Attends meetings of clubs or organizations: Not on file    Relationship status: Not on file  . Intimate partner violence    Fear of current or ex partner: Not on file    Emotionally abused: Not on file    Physically abused: Not on file    Forced sexual activity: Not on file  Other Topics Concern  . Not on file  Social History Narrative  . Not on file  Allergies:  No Known Allergies  Medications: Prior to Admission medications   Medication Sig Start Date End Date Taking? Authorizing Provider  Prenatal Vit-Fe Fumarate-FA (MULTIVITAMIN-PRENATAL) 27-0.8 MG TABS tablet Take 1 tablet by mouth daily at 12 noon.   Yes [provider]  acetaminophen (TYLENOL) 325 MG tablet Take 2 tablets (650 mg total) by mouth every 4 (four) hours as needed for mild pain, moderate pain or headache (for pain scale < 4). Patient not taking: Reported on 05/08/2019 03/23/19   Dalia Heading, CNM  Blood Glucose Monitoring Suppl (ACCU-CHEK GUIDE ME) w/Device KIT 1 each by Does not apply route 4 (four) times daily. Patient not taking: Reported on  05/08/2019 01/22/19   Adrian Prows R, MD  glucose blood (ACCU-CHEK GUIDE) test strip Use as instructed Patient not taking: Reported on 05/08/2019 01/22/19   Homero Fellers, MD  ibuprofen (ADVIL) 600 MG tablet Take 1 tablet (600 mg total) by mouth every 6 (six) hours as needed for mild pain, moderate pain or cramping. Patient not taking: Reported on 05/08/2019 03/23/19   Dalia Heading, CNM  Lancets ([place]) lancets Use as instructed Patient not taking: Reported on 05/08/2019 01/24/19   Rexene Agent, CNM    Physical Exam Blood pressure 122/70, height '5\' 1"'$  (1.549 m), weight 211 lb (95.7 kg), last menstrual period 06/15/2018, currently breastfeeding.    General: NAD HEENT: normocephalic, anicteric Pulmonary: No increased work of breathing Abdomen: NABS, soft, non-tender, non-distended.  Umbilicus without lesions.  No hepatomegaly, splenomegaly or masses palpable. No evidence of hernia. Genitourinary:  External: Normal external female genitalia.  Normal urethral meatus, normal Bartholin's and Skene's glands.    Vagina: Normal vaginal mucosa, no evidence of prolapse.    Cervix: Grossly normal in appearance, no bleeding, no CMT  Uterus: Non-enlarged, mobile, normal contour.    Adnexa: ovaries non-enlarged, no adnexal masses  Rectal: deferred Extremities: no edema, erythema, or tenderness Neurologic: Grossly intact Psychiatric: mood appropriate, affect full  Edinburgh Postnatal Depression Scale - 05/08/19 1500      Edinburgh Postnatal Depression Scale:  In the Past 7 Days   I have been able to laugh and see the funny side of things.  0    I have looked forward with enjoyment to things.  0    I have blamed myself unnecessarily when things went wrong.  1    I have been anxious or worried for no good reason.  0    I have felt scared or panicky for no good reason.  0    Things have been getting on top of me.  0    I have been so unhappy that I have had difficulty  sleeping.  0    I have felt sad or miserable.  0    I have been so unhappy that I have been crying.  0    The thought of harming myself has occurred to me.  0    Edinburgh Postnatal Depression Scale Total  1       Assessment: 28 y.o. [id number] presenting for 6 week postpartum visit  Plan: Problem List Items Addressed This Visit    None    Visit Diagnoses    6 weeks postpartum follow-up    -  Primary       1) Contraception - Education given regarding options for contraception, as well as compatibility with breast feeding if applicable.  Patient plans on tubal ligation for contraception.  2)  Pap - ASCCP guidelines  and rational discussed.  ASCCP guidelines and rationale discussed.  Patient opts for every 3 years screening interval  3) Patient underwent screening for postpartum depression with no signs of depression  4) Return in about 1 year (around 05/07/2020) for annual established gyn.   Rod Can, [facility] 05/08/2019, 5:04 PM

## 2019-05-09 ENCOUNTER — Encounter: Payer: Self-pay | Admitting: Certified Nurse Midwife

## 2019-05-09 LAB — GLUCOSE TOLERANCE, 2 HOURS
Glucose, 2 hour: 66 mg/dL (ref 65–139)
Glucose, GTT - Fasting: 95 mg/dL (ref 65–99)

## 2019-05-15 ENCOUNTER — Ambulatory Visit: Payer: BC Managed Care – PPO | Admitting: Advanced Practice Midwife

## 2019-05-15 ENCOUNTER — Other Ambulatory Visit: Payer: BC Managed Care – PPO

## 2019-11-27 DIAGNOSIS — Z1152 Encounter for screening for COVID-19: Secondary | ICD-10-CM | POA: Diagnosis not present

## 2020-05-27 DIAGNOSIS — Z23 Encounter for immunization: Secondary | ICD-10-CM | POA: Diagnosis not present

## 2020-12-31 DIAGNOSIS — J069 Acute upper respiratory infection, unspecified: Secondary | ICD-10-CM | POA: Diagnosis not present

## 2022-03-28 ENCOUNTER — Emergency Department: Payer: BC Managed Care – PPO

## 2022-03-28 ENCOUNTER — Emergency Department
Admission: EM | Admit: 2022-03-28 | Discharge: 2022-03-29 | Disposition: A | Discharge status: Home / Self Care | Payer: BC Managed Care – PPO | Attending: Emergency Medicine | Admitting: Emergency Medicine

## 2022-03-28 DIAGNOSIS — R509 Fever, unspecified: Secondary | ICD-10-CM | POA: Diagnosis present

## 2022-03-28 DIAGNOSIS — R519 Headache, unspecified: Secondary | ICD-10-CM | POA: Insufficient documentation

## 2022-03-28 DIAGNOSIS — H5710 Ocular pain, unspecified eye: Secondary | ICD-10-CM | POA: Diagnosis not present

## 2022-03-28 DIAGNOSIS — E876 Hypokalemia: Secondary | ICD-10-CM | POA: Diagnosis not present

## 2022-03-28 DIAGNOSIS — M791 Myalgia, unspecified site: Secondary | ICD-10-CM | POA: Insufficient documentation

## 2022-03-28 DIAGNOSIS — R000 Tachycardia, unspecified: Secondary | ICD-10-CM | POA: Diagnosis not present

## 2022-03-28 LAB — COMPREHENSIVE METABOLIC PANEL
ALT: 35 U/L (ref 0–44)
AST: 31 U/L (ref 15–41)
Albumin: 4.1 g/dL (ref 3.5–5.0)
Alkaline Phosphatase: 86 U/L (ref 38–126)
Anion gap: 7 (ref 5–15)
BUN: 7 mg/dL (ref 6–20)
CO2: 25 mmol/L (ref 22–32)
Calcium: 8.5 mg/dL — ABNORMAL LOW (ref 8.9–10.3)
Chloride: 104 mmol/L (ref 98–111)
Creatinine, Ser: 0.69 mg/dL (ref 0.44–1.00)
GFR, Estimated: >60 mL/min (ref >60)
Glucose, Bld: 115 mg/dL — ABNORMAL HIGH (ref 70–99)
Potassium: 3.3 mmol/L — ABNORMAL LOW (ref 3.5–5.1)
Sodium: 136 mmol/L (ref 135–145)
Total Bilirubin: 0.6 mg/dL (ref 0.3–1.2)
Total Protein: 7.6 g/dL (ref 6.5–8.1)

## 2022-03-28 LAB — URINALYSIS, ROUTINE W REFLEX MICROSCOPIC
Bilirubin Urine: NEGATIVE
Glucose, UA: NEGATIVE mg/dL
Hgb urine dipstick: NEGATIVE
Ketones, ur: NEGATIVE mg/dL
Nitrite: NEGATIVE
Protein, ur: NEGATIVE mg/dL
Specific Gravity, Urine: 1.006 (ref 1.005–1.030)
pH: 6 (ref 5.0–8.0)

## 2022-03-28 LAB — CBC WITH DIFFERENTIAL/PLATELET
Abs Immature Granulocytes: 0.05 10*3/uL (ref 0.00–0.07)
Basophils Absolute: 0 10*3/uL (ref 0.0–0.1)
Basophils Relative: 0 %
Eosinophils Absolute: 0 10*3/uL (ref 0.0–0.5)
Eosinophils Relative: 0 %
HCT: 38 % (ref 36.0–46.0)
Hemoglobin: 12.1 g/dL (ref 12.0–15.0)
Immature Granulocytes: 1 %
Lymphocytes Relative: 12 %
Lymphs Abs: 1 10*3/uL (ref 0.7–4.0)
MCH: 26.7 pg (ref 26.0–34.0)
MCHC: 31.8 g/dL (ref 30.0–36.0)
MCV: 83.9 fL (ref 80.0–100.0)
Monocytes Absolute: 0.7 10*3/uL (ref 0.1–1.0)
Monocytes Relative: 8 %
Neutro Abs: 6.9 10*3/uL (ref 1.7–7.7)
Neutrophils Relative %: 79 %
Platelets: 212 10*3/uL (ref 150–400)
RBC: 4.53 MIL/uL (ref 3.87–5.11)
RDW: 13.5 % (ref 11.5–15.5)
WBC: 8.7 10*3/uL (ref 4.0–10.5)
nRBC: 0 % (ref 0.0–0.2)

## 2022-03-28 LAB — PREGNANCY, URINE: Preg Test, Ur: NEGATIVE

## 2022-03-28 MED ORDER — FOSFOMYCIN TROMETHAMINE 3 G PO PACK
3.0000 g | PACK | Freq: Once | ORAL | Status: AC
  Administered 2022-03-29: 3 g via ORAL
  Filled 2022-03-28: qty 3

## 2022-03-28 MED ORDER — POTASSIUM CHLORIDE CRYS ER 20 MEQ PO TBCR
40.0000 meq | EXTENDED_RELEASE_TABLET | Freq: Once | ORAL | Status: AC
  Administered 2022-03-29: 40 meq via ORAL
  Filled 2022-03-28: qty 2

## 2022-03-28 MED ORDER — IBUPROFEN 400 MG PO TABS
400.0000 mg | ORAL_TABLET | Freq: Once | ORAL | Status: AC
  Administered 2022-03-28: 400 mg via ORAL
  Filled 2022-03-28: qty 1

## 2022-03-28 MED ORDER — ACETAMINOPHEN 500 MG PO TABS
1000.0000 mg | ORAL_TABLET | Freq: Once | ORAL | Status: AC
  Administered 2022-03-29: 1000 mg via ORAL
  Filled 2022-03-28: qty 2

## 2022-03-28 MED ORDER — SODIUM CHLORIDE 0.9 % IV BOLUS
1000.0000 mL | Freq: Once | INTRAVENOUS | Status: AC
  Administered 2022-03-29: 1000 mL via INTRAVENOUS

## 2022-03-28 NOTE — ED Triage Notes (Addendum)
Video interpretor in use.  Ambulatory to triage with c/o fever since yesterday. Seen today at urgent care and was told to come to ED for possible workup for meningitis.  C/o chills, body aches, and headaches. Denies N/V. Denies joint pain, stiffness or rigidity. Denies chest pain, Denies SOB Negative Covid test at urgent care today per pt. Denies sore throat.

## 2022-03-28 NOTE — Discharge Instructions (Signed)
I suspect that you have a viral illness.  You can take Tylenol and Motrin for fever.  We did give you a one-time dose of an antibiotic which will treat any urinary tract infection because your urine sample was somewhat equivocal for UTI.  If you develop any worsening symptoms such as confusion worsening headache inability to eat or drink please return to the emergency department.

## 2022-03-28 NOTE — ED Provider Notes (Signed)
[facility] Provider Note    Event Date/Time   First MD Initiated Contact with Patient 03/28/22 2033     (approximate)   History   No chief complaint on file.   HPI  Cheyenne Gomez is a 31 y.o. female past medical history of gestational diabetes presents today with fever headache and body aches.  Symptoms started yesterday.  Had temp of 100.1 at home.  She endorses frontal headache and diffuse body aches involving both arms and legs.  Denies abdominal pain nausea vomiting diarrhea.  Still eating and drinking normally.  Does endorse some eye pain and redness denies sore throat nasal discharge or congestion.  Denies visual change numbness tingling weakness or neck pain.  Patient denies urinary symptoms back pain.  Denies sick contacts.  She was seen at urgent care and had negative COVID test was referred to the ED for evaluation for meningitis apparently.     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   Gestational diabetes mellitus (GDM) affecting fourth pregnancy 01/15/2019   Pyelonephritis affecting pregnancy in second trimester 11/04/2018   Obesity in pregnancy, antepartum 09/12/2018   Supervision of high-risk pregnancy 08/27/2018   Rh negative state in antepartum period 08/27/2018   Calculus of gallbladder with biliary obstruction but without cholecystitis      Physical Exam  Triage Vital Signs: ED Triage Vitals  Enc Vitals Group     BP 03/28/22 2020 111/70     Pulse Rate 03/28/22 2020 (!) 107     Resp 03/28/22 2020 18     Temp 03/28/22 2020 100.3 F (37.9 C)      Temp Source 03/28/22 2020 Oral     SpO2 03/28/22 2020 98 %     Weight 03/28/22 2028 210 lb (95.3 kg)     Height 03/28/22 2028 5\' 1"  (1.549 m)     Head Circumference --      Peak Flow --      Pain Score 03/28/22 2028 5     Pain Loc --      Pain Edu? --      Excl. in GC? --     Most recent vital signs: Vitals:   03/28/22 2230 03/28/22 2231  BP: 128/63   Pulse: (!) 108   Resp: (!) 25   Temp:  (!) 100.4 F (38 C)  SpO2: 96%      General: Awake, no distress.  Patient is nontoxic-appearing CV:  Good peripheral perfusion.  Resp:  Normal effort.  Lungs are clear no increased work of breathing Abd:  No distention.  Soft throughout, no CVA tenderness Neuro:             Awake, Alert, Oriented x 3  Other:  Aox3, nml speech  PERRL, EOMI, face symmetric, nml tongue movement  5/5 strength in the BL upper and lower extremities  Sensation grossly intact in the BL upper and lower extremities  Finger-nose-finger intact BL  No meningismus, patient able to rotate head right and left rapidly without pain   ED Results / Procedures / Treatments  Labs (all labs ordered are listed,  but only abnormal results are displayed) Labs Reviewed  COMPREHENSIVE METABOLIC PANEL - Abnormal; Notable for the following components:      Result Value   Potassium 3.3 (*)    Glucose, Bld 115 (*)    Calcium 8.5 (*)    All other components within normal limits  URINALYSIS, ROUTINE W REFLEX MICROSCOPIC - Abnormal; Notable for the following components:   Color, Urine STRAW (*)    APPearance HAZY (*)    Leukocytes,Ua MODERATE (*)    Bacteria, UA RARE (*)    All other components within normal limits  URINE CULTURE  CBC WITH DIFFERENTIAL/PLATELET  PREGNANCY, URINE     EKG     RADIOLOGY I reviewed and interpreted the CXR which does not show any acute cardiopulmonary process    PROCEDURES:  Critical Care performed: No  Procedures   MEDICATIONS ORDERED IN ED: Medications  sodium chloride  0.9 % bolus 1,000 mL (has no administration in time range)  acetaminophen (TYLENOL) tablet 1,000 mg (has no administration in time range)  ibuprofen (ADVIL) tablet 400 mg (400 mg Oral Given 03/28/22 2202)     IMPRESSION / MDM / ASSESSMENT AND PLAN / ED COURSE  I reviewed the triage vital signs and the nursing notes.                              Patient's presentation is most consistent with acute presentation with potential threat to life or bodily function.  Differential diagnosis includes, but is not limited to, viral illness, pneumonia, UTI, meningitis/encephalitis  The patient is a 31 year old female is otherwise healthy presents today with fever headache and body aches.  Symptoms started yesterday temp of 100.1 at home.  She complains of a headache as well as diffuse body aches.  Headache is frontal not associated visual change numbness tingling weakness.  Has been coming and going not associated with vomiting.  Denies any neck pain.  She has no GI symptoms no urinary symptoms no respiratory symptoms.  She is febrile with low-grade fever to 100.3 mildly tachycardic but the rest of her vital signs are within normal limits.  Patient is nontoxic-appearing her lungs are clear normal posterior oropharynx abdomen soft nontender no CVA tenderness.  Her neurologic exam is completely intact and she has no meningismus supple neck.  Labs reviewed she has a normal white count, mild hypokalemia to 3.3.  UA with 11-20 white cells rare bacteria but 6-10 squames.  She has no urinary symptoms so I suspect that this is contaminant.  Repeat vital signs after Motrin show ongoing tachycardia pulse 108 Temp 100.4.  Will place IV and give a liter of fluid and Tylenol.  We will treat with a dose of fosfomycin to cover for potential UTI and send urine culture but again my suspicion for true pyelonephritis is low.  Patient signed out to oncoming provider pending assessment of vital signs after fluids.       FINAL  CLINICAL IMPRESSION(S) / ED DIAGNOSES   Final diagnoses:  Fever, unspecified fever cause     Rx / DC Orders   ED Discharge Orders     None        Note:  This document was prepared using Dragon voice recognition software and may include unintentional dictation errors.   Georga Hacking, MD 03/28/22 [phone]

## 2022-03-29 DIAGNOSIS — R509 Fever, unspecified: Secondary | ICD-10-CM | POA: Diagnosis not present

## 2022-03-29 NOTE — ED Notes (Signed)
Pt given DC instructions and verbalizes understanding of these instructions.  Pt is A&O x4 at this time, and in NAD at DC.  Pt ambulatory to lobby.

## 2022-03-29 NOTE — ED Provider Notes (Signed)
Patient received in signout from Dr. Sidney Ace for evaluation of fever, myalgias and headache.  Thought to be a viral syndrome.  After IV fluids, potassium repletion and antipyretics her vital signs normalized and patient reports feeling better.  We will discharge per original plan of care after a dose of fosfomycin.   Delton Prairie, MD 03/29/22 [phone]

## 2022-03-30 LAB — URINE CULTURE: Culture: 50000 — AB

## 2022-10-19 ENCOUNTER — Emergency Department: Payer: BC Managed Care – PPO

## 2022-10-19 ENCOUNTER — Encounter: Payer: Self-pay | Admitting: Emergency Medicine

## 2022-10-19 ENCOUNTER — Other Ambulatory Visit: Payer: Self-pay

## 2022-10-19 ENCOUNTER — Emergency Department
Admission: EM | Admit: 2022-10-19 | Discharge: 2022-10-19 | Disposition: A | Discharge status: Home / Self Care | Payer: BC Managed Care – PPO | Attending: Emergency Medicine | Admitting: Emergency Medicine

## 2022-10-19 DIAGNOSIS — R519 Headache, unspecified: Secondary | ICD-10-CM | POA: Diagnosis not present

## 2022-10-19 DIAGNOSIS — S3991XA Unspecified injury of abdomen, initial encounter: Secondary | ICD-10-CM | POA: Diagnosis not present

## 2022-10-19 DIAGNOSIS — M25511 Pain in right shoulder: Secondary | ICD-10-CM | POA: Insufficient documentation

## 2022-10-19 DIAGNOSIS — S20211A Contusion of right front wall of thorax, initial encounter: Secondary | ICD-10-CM

## 2022-10-19 DIAGNOSIS — S161XXA Strain of muscle, fascia and tendon at neck level, initial encounter: Secondary | ICD-10-CM | POA: Insufficient documentation

## 2022-10-19 DIAGNOSIS — Y92410 Unspecified street and highway as the place of occurrence of the external cause: Secondary | ICD-10-CM | POA: Diagnosis not present

## 2022-10-19 DIAGNOSIS — S7001XA Contusion of right hip, initial encounter: Secondary | ICD-10-CM | POA: Diagnosis not present

## 2022-10-19 DIAGNOSIS — S39012A Strain of muscle, fascia and tendon of lower back, initial encounter: Secondary | ICD-10-CM

## 2022-10-19 DIAGNOSIS — R82998 Other abnormal findings in urine: Secondary | ICD-10-CM | POA: Diagnosis not present

## 2022-10-19 DIAGNOSIS — S199XXA Unspecified injury of neck, initial encounter: Secondary | ICD-10-CM | POA: Diagnosis present

## 2022-10-19 LAB — CBC
HCT: 39.5 % (ref 36.0–46.0)
Hemoglobin: 12.5 g/dL (ref 12.0–15.0)
MCH: 26.5 pg (ref 26.0–34.0)
MCHC: 31.6 g/dL (ref 30.0–36.0)
MCV: 83.9 fL (ref 80.0–100.0)
Platelets: 272 10*3/uL (ref 150–400)
RBC: 4.71 MIL/uL (ref 3.87–5.11)
RDW: 13 % (ref 11.5–15.5)
WBC: 9.1 10*3/uL (ref 4.0–10.5)
nRBC: 0 % (ref 0.0–0.2)

## 2022-10-19 LAB — BASIC METABOLIC PANEL
Anion gap: 6 (ref 5–15)
BUN: 10 mg/dL (ref 6–20)
CO2: 28 mmol/L (ref 22–32)
Calcium: 8.8 mg/dL — ABNORMAL LOW (ref 8.9–10.3)
Chloride: 102 mmol/L (ref 98–111)
Creatinine, Ser: 0.62 mg/dL (ref 0.44–1.00)
GFR, Estimated: >60 mL/min (ref >60)
Glucose, Bld: 85 mg/dL (ref 70–99)
Potassium: 3.7 mmol/L (ref 3.5–5.1)
Sodium: 136 mmol/L (ref 135–145)

## 2022-10-19 LAB — URINALYSIS, COMPLETE (UACMP) WITH MICROSCOPIC
Bilirubin Urine: NEGATIVE
Glucose, UA: NEGATIVE mg/dL
Hgb urine dipstick: NEGATIVE
Ketones, ur: 20 mg/dL — AB
Nitrite: NEGATIVE
Protein, ur: 30 mg/dL — AB
Specific Gravity, Urine: 1.029 (ref 1.005–1.030)
Squamous Epithelial / HPF: >50 /HPF (ref 0–5)
WBC, UA: >50 WBC/hpf (ref 0–5)
pH: 5 (ref 5.0–8.0)

## 2022-10-19 LAB — POC URINE PREG, ED: Preg Test, Ur: NEGATIVE

## 2022-10-19 MED ORDER — SODIUM CHLORIDE 0.9 % IV BOLUS
1000.0000 mL | Freq: Once | INTRAVENOUS | Status: AC
  Administered 2022-10-19: 1000 mL via INTRAVENOUS

## 2022-10-19 MED ORDER — MORPHINE SULFATE (PF) 4 MG/ML IV SOLN
4.0000 mg | Freq: Once | INTRAVENOUS | Status: AC
  Administered 2022-10-19: 4 mg via INTRAVENOUS
  Filled 2022-10-19: qty 1

## 2022-10-19 MED ORDER — MELOXICAM 15 MG PO TABS: 15.0000 mg | ORAL_TABLET | Freq: Every day | ORAL | 0 refills | Status: AC

## 2022-10-19 MED ORDER — METHOCARBAMOL 1000 MG/10ML IJ SOLN
500.0000 mg | Freq: Once | INTRAVENOUS | Status: AC
  Administered 2022-10-19: 500 mg via INTRAVENOUS
  Filled 2022-10-19: qty 500

## 2022-10-19 MED ORDER — METHOCARBAMOL 1000 MG/10ML IJ SOLN: 500.0000 mg | Freq: Once | INTRAMUSCULAR | Status: DC

## 2022-10-19 MED ORDER — IOHEXOL 300 MG/ML  SOLN
100.0000 mL | Freq: Once | INTRAMUSCULAR | Status: AC | PRN
  Administered 2022-10-19: 100 mL via INTRAVENOUS

## 2022-10-19 MED ORDER — CYCLOBENZAPRINE HCL 5 MG PO TABS: 5.0000 mg | ORAL_TABLET | Freq: Three times a day (TID) | ORAL | 0 refills | Status: DC | PRN

## 2022-10-19 NOTE — Discharge Instructions (Addendum)
Please take medications as prescribed.  Call orthopedics to schedule follow-up appointment.  You may benefit from a referral to physical therapy.  Return to the ER for any increased pain, numbness, chest pain, shortness of breath, worsening headaches, nausea, vomiting or any urgent changes in your health.
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
## 2022-10-20 LAB — URINE CULTURE: Culture: 50000 — AB

## 2024-08-02 ENCOUNTER — Encounter: Payer: Self-pay | Admitting: *Deleted

## 2024-08-02 ENCOUNTER — Emergency Department
Admission: EM | Admit: 2024-08-02 | Discharge: 2024-08-02 | Disposition: A | Discharge status: Home / Self Care | Attending: Emergency Medicine | Admitting: Emergency Medicine

## 2024-08-02 ENCOUNTER — Other Ambulatory Visit: Payer: Self-pay

## 2024-08-02 DIAGNOSIS — N3001 Acute cystitis with hematuria: Secondary | ICD-10-CM | POA: Insufficient documentation

## 2024-08-02 DIAGNOSIS — R109 Unspecified abdominal pain: Secondary | ICD-10-CM | POA: Diagnosis present

## 2024-08-02 LAB — URINALYSIS, ROUTINE W REFLEX MICROSCOPIC
Bacteria, UA: NONE SEEN
Bilirubin Urine: NEGATIVE
Glucose, UA: NEGATIVE mg/dL
Ketones, ur: 5 mg/dL — AB
Nitrite: POSITIVE — AB
Protein, ur: >=300 mg/dL — AB
RBC / HPF: >50 RBC/hpf (ref 0–5)
Specific Gravity, Urine: 1.018 (ref 1.005–1.030)
WBC, UA: >50 WBC/hpf (ref 0–5)
pH: 7 (ref 5.0–8.0)

## 2024-08-02 LAB — POC URINE PREG, ED: Preg Test, Ur: NEGATIVE

## 2024-08-02 MED ORDER — CEPHALEXIN 500 MG PO CAPS
500.0000 mg | ORAL_CAPSULE | Freq: Once | ORAL | Status: AC
  Administered 2024-08-02: 500 mg via ORAL
  Filled 2024-08-02: qty 1

## 2024-08-02 MED ORDER — CEPHALEXIN 500 MG PO CAPS: 500.0000 mg | ORAL_CAPSULE | Freq: Four times a day (QID) | ORAL | 0 refills | Status: AC

## 2024-08-02 MED ORDER — TRAMADOL HCL 50 MG PO TABS: 50.0000 mg | ORAL_TABLET | Freq: Two times a day (BID) | ORAL | 0 refills | Status: AC

## 2024-08-02 MED ORDER — TRAMADOL HCL 50 MG PO TABS
50.0000 mg | ORAL_TABLET | Freq: Once | ORAL | Status: AC
  Administered 2024-08-02: 50 mg via ORAL
  Filled 2024-08-02: qty 1

## 2024-08-02 NOTE — Discharge Instructions (Addendum)
 You are being treated for urinary tract infection.  Take the prescription antibiotic 4 times daily as directed.  Take the pain medicine as needed.  Follow-up with your primary provider or OBG for ongoing evaluation.  Return to the ED for worsening pain or fevers.  You will be notified if your urine culture requires a change in your antibiotic.  Est recibiendo tratamiento para una infeccin del tracto urinario. Tome el antibitico recetado 4 veces al da segn las indicaciones. Tome el analgsico segn sea necesario. Acuda a su mdico de cabecera o a su gineclogo para una evaluacin continua. Regrese a building control surveyor o la fiebre [place]. Se le notificar si su urocultivo requiere un cambio de antibitico.

## 2024-08-02 NOTE — ED Triage Notes (Signed)
 Via video spanish interpreter, the patient reports that for 3 days she has had lower right back pain, painful urination and orange colored urine. LMP 07/14/24. Has been taking OTC AZO.

## 2024-08-02 NOTE — ED Provider Notes (Signed)
 [facility] Emergency Department Provider Note     Event Date/Time   First MD Initiated Contact with Patient 08/02/24 1939     (approximate)   History   Back Pain   HPI  History limited by Spanish language.  Tele-interpreter [phone]) present for interview exam.  Cheyenne Gomez is a 33 y.o. female with a past medical history of gallstones, gestational diabetes, and pyelonephritis presents to the ED for evaluation of 3 days of lower right back pain and flank pain.  Patient is also endorsing dysuria.  She has had some orange-colored urine secondary to taking OTC Azo. She is endorsing nausea w/o vomiting.  No reports of any fevers, chest pain, or shortness of breath.   Physical Exam   Triage Vital Signs: ED Triage Vitals [08/02/24 1925]  Encounter Vitals Group     BP (!) 149/92     Girls Systolic BP Percentile      Girls Diastolic BP Percentile      Boys Systolic BP Percentile      Boys Diastolic BP Percentile      Pulse Rate 99     Resp 18     Temp 98.7 F (37.1 C)     Temp Source Oral     SpO2 98 %     Weight      Height      Head Circumference      Peak Flow      Pain Score      Pain Loc      Pain Education      Exclude from Growth Chart     Most recent vital signs: Vitals:   08/02/24 1925  BP: (!) 149/92  Pulse: 99  Resp: 18  Temp: 98.7 F (37.1 C)  SpO2: 98%    General Awake, no distress. NAD HEENT NCAT. PERRL. EOMI. No rhinorrhea. Mucous membranes are moist.  CV:  Good peripheral perfusion.  RESP:  Normal effort.  ABD:  No distention. Mild bilateral flank pain MSK:  AROM GYN:  deferred  ED Results / Procedures / Treatments   Labs (all labs ordered are listed, but only abnormal results are displayed) Labs Reviewed  URINALYSIS, ROUTINE W REFLEX MICROSCOPIC - Abnormal; Notable for the following components:      Result Value   Color, Urine AMBER (*)    APPearance CLOUDY (*)    Hgb urine dipstick MODERATE (*)     Ketones, ur 5 (*)    Protein, ur >=300 (*)    Nitrite POSITIVE (*)    Leukocytes,Ua MODERATE (*)    All other components within normal limits  URINE CULTURE  POC URINE PREG, ED     EKG   RADIOLOGY   No results found.   PROCEDURES:  Critical Care performed: No  Procedures   MEDICATIONS ORDERED IN ED: Medications  cephALEXin  (KEFLEX ) capsule 500 mg (500 mg Oral Given 08/02/24 2042)  traMADol  (ULTRAM ) tablet 50 mg (50 mg Oral Given 08/02/24 2042)     IMPRESSION / MDM / ASSESSMENT AND PLAN / ED COURSE  I reviewed the triage vital signs and the nursing notes.                              Differential diagnosis includes, but is not limited to, ovarian cyst, ovarian torsion, acute appendicitis, diverticulitis, urinary tract infection/pyelonephritis, endometriosis, bowel obstruction, colitis, renal colic, gastroenteritis, hernia, fibroids, pregnancy related pain  including ectopic pregnancy, etc.  Patient's presentation is most consistent with acute complicated illness / injury requiring diagnostic workup.  Patient's diagnosis is consistent with flank pain and dysuria.  Patient presents afebrile in no contrast, endorsing dysuria for the last 2 days.  Urinalysis reveals nitrite positive with moderate leukocytes and RBCs.  Urine culture pending at this time.  Patient is stable for outpatient management at this time.  Patient will be discharged home with prescriptions for Keflex  and Ultram . Patient is to follow up with the primary provider as discussed, as needed or otherwise directed. Patient is given ED precautions to return to the ED for any worsening or new symptoms.  Clinical Course as of 08/03/24 1630  Sat Aug 02, 2024  2107 Nitrite(!): POSITIVE [JM]    Clinical Course User Index [JM] Cici Rodriges, Candida LULLA Kings, PA-C    FINAL CLINICAL IMPRESSION(S) / ED DIAGNOSES   Final diagnoses:  Acute cystitis with hematuria     Rx / DC Orders   ED Discharge Orders           Ordered    cephALEXin  (KEFLEX ) 500 MG capsule  4 times daily        08/02/24 2115    traMADol  (ULTRAM ) 50 MG tablet  2 times daily        08/02/24 2115             Note:  This document was prepared using Dragon voice recognition software and may include unintentional dictation errors.    Loyd Candida LULLA Kings, PA-C 08/03/24 1630    Waymond Lorelle Cummins, MD 08/03/24 [phone]

## 2024-08-05 LAB — URINE CULTURE
Culture: 80000 — AB
Special Requests: NORMAL
